# Patient Record
Sex: Female | Born: 1960 | Race: Black or African American | Hispanic: No | Marital: Married | Smoking: Former smoker
Health system: Southern US, Community
[De-identification: ages and names within clinical notes are randomized; demographics above are authoritative.]

## PROBLEM LIST (undated history)

## (undated) DIAGNOSIS — E785 Hyperlipidemia, unspecified: Secondary | ICD-10-CM

## (undated) DIAGNOSIS — I2510 Atherosclerotic heart disease of native coronary artery without angina pectoris: Secondary | ICD-10-CM

## (undated) DIAGNOSIS — I10 Essential (primary) hypertension: Secondary | ICD-10-CM

## (undated) DIAGNOSIS — I219 Acute myocardial infarction, unspecified: Secondary | ICD-10-CM

## (undated) HISTORY — PX: TUBAL LIGATION: SHX77

## (undated) HISTORY — PX: CORONARY ANGIOPLASTY: SHX604

---

## 2012-01-06 ENCOUNTER — Ambulatory Visit: Payer: Self-pay | Admitting: Gastroenterology

## 2015-01-07 ENCOUNTER — Encounter (HOSPITAL_COMMUNITY): Payer: Self-pay | Admitting: *Deleted

## 2015-01-07 ENCOUNTER — Inpatient Hospital Stay (HOSPITAL_COMMUNITY)
Admission: EM | Admit: 2015-01-07 | Discharge: 2015-01-09 | DRG: 246 | Disposition: A | Discharge status: Home / Self Care | Payer: Managed Care, Other (non HMO) | Source: Other Acute Inpatient Hospital | Attending: Cardiovascular Disease | Admitting: Cardiovascular Disease

## 2015-01-07 ENCOUNTER — Encounter (HOSPITAL_COMMUNITY)
Admission: EM | Disposition: A | Discharge status: Home / Self Care | Payer: Self-pay | Source: Other Acute Inpatient Hospital | Attending: Cardiovascular Disease

## 2015-01-07 ENCOUNTER — Other Ambulatory Visit: Payer: Self-pay

## 2015-01-07 ENCOUNTER — Ambulatory Visit (HOSPITAL_COMMUNITY): Admit: 2015-01-07 | Payer: Self-pay | Admitting: Interventional Cardiology

## 2015-01-07 DIAGNOSIS — I2111 ST elevation (STEMI) myocardial infarction involving right coronary artery: Secondary | ICD-10-CM

## 2015-01-07 DIAGNOSIS — E785 Hyperlipidemia, unspecified: Secondary | ICD-10-CM | POA: Diagnosis present

## 2015-01-07 DIAGNOSIS — Z79899 Other long term (current) drug therapy: Secondary | ICD-10-CM

## 2015-01-07 DIAGNOSIS — I2510 Atherosclerotic heart disease of native coronary artery without angina pectoris: Secondary | ICD-10-CM | POA: Diagnosis present

## 2015-01-07 DIAGNOSIS — I10 Essential (primary) hypertension: Secondary | ICD-10-CM | POA: Diagnosis present

## 2015-01-07 DIAGNOSIS — E119 Type 2 diabetes mellitus without complications: Secondary | ICD-10-CM | POA: Diagnosis present

## 2015-01-07 DIAGNOSIS — Z720 Tobacco use: Secondary | ICD-10-CM

## 2015-01-07 DIAGNOSIS — I2119 ST elevation (STEMI) myocardial infarction involving other coronary artery of inferior wall: Secondary | ICD-10-CM | POA: Diagnosis present

## 2015-01-07 DIAGNOSIS — I5033 Acute on chronic diastolic (congestive) heart failure: Secondary | ICD-10-CM | POA: Diagnosis present

## 2015-01-07 DIAGNOSIS — M549 Dorsalgia, unspecified: Secondary | ICD-10-CM | POA: Diagnosis present

## 2015-01-07 DIAGNOSIS — F17200 Nicotine dependence, unspecified, uncomplicated: Secondary | ICD-10-CM | POA: Diagnosis present

## 2015-01-07 DIAGNOSIS — I213 ST elevation (STEMI) myocardial infarction of unspecified site: Secondary | ICD-10-CM

## 2015-01-07 HISTORY — DX: Essential (primary) hypertension: I10

## 2015-01-07 HISTORY — DX: Hyperlipidemia, unspecified: E78.5

## 2015-01-07 HISTORY — DX: Acute myocardial infarction, unspecified: I21.9

## 2015-01-07 HISTORY — DX: Atherosclerotic heart disease of native coronary artery without angina pectoris: I25.10

## 2015-01-07 HISTORY — PX: LEFT HEART CATHETERIZATION WITH CORONARY ANGIOGRAM: SHX5451

## 2015-01-07 HISTORY — PX: CORONARY STENT PLACEMENT: SHX1402

## 2015-01-07 LAB — COMPREHENSIVE METABOLIC PANEL
ALK PHOS: 119 U/L — AB (ref 39–117)
ALT: 18 U/L (ref 0–35)
AST: 69 U/L — AB (ref 0–37)
Albumin: 3.4 g/dL — ABNORMAL LOW (ref 3.5–5.2)
Anion gap: 8 (ref 5–15)
BUN: 11 mg/dL (ref 6–23)
CHLORIDE: 102 mmol/L (ref 96–112)
CO2: 26 mmol/L (ref 19–32)
Calcium: 8.6 mg/dL (ref 8.4–10.5)
Creatinine, Ser: 0.87 mg/dL (ref 0.50–1.10)
GFR calc Af Amer: 87 mL/min — ABNORMAL LOW (ref >90)
GFR calc non Af Amer: 75 mL/min — ABNORMAL LOW (ref >90)
GLUCOSE: 122 mg/dL — AB (ref 70–99)
Potassium: 4 mmol/L (ref 3.5–5.1)
Sodium: 136 mmol/L (ref 135–145)
Total Bilirubin: 0.5 mg/dL (ref 0.3–1.2)
Total Protein: 7.2 g/dL (ref 6.0–8.3)

## 2015-01-07 LAB — MRSA PCR SCREENING: MRSA by PCR: NEGATIVE

## 2015-01-07 LAB — TROPONIN I
TROPONIN I: 12.76 ng/mL — AB (ref <0.031)
Troponin I: 10.31 ng/mL (ref <0.031)
Troponin I: 25.41 ng/mL (ref <0.031)
Troponin I: 21.91 ng/mL (ref <0.031)

## 2015-01-07 LAB — LIPID PANEL
CHOLESTEROL: 221 mg/dL — AB (ref 0–200)
HDL: 52 mg/dL (ref >39)
LDL CALC: 156 mg/dL — AB (ref 0–99)
Total CHOL/HDL Ratio: 4.3 RATIO
Triglycerides: 65 mg/dL (ref <150)
VLDL: 13 mg/dL (ref 0–40)

## 2015-01-07 LAB — TSH: TSH: 1.212 u[IU]/mL (ref 0.350–4.500)

## 2015-01-07 LAB — CBC
HEMATOCRIT: 41.9 % (ref 36.0–46.0)
Hemoglobin: 14.2 g/dL (ref 12.0–15.0)
MCH: 27.2 pg (ref 26.0–34.0)
MCHC: 33.9 g/dL (ref 30.0–36.0)
MCV: 80.3 fL (ref 78.0–100.0)
Platelets: 215 10*3/uL (ref 150–400)
RBC: 5.22 MIL/uL — AB (ref 3.87–5.11)
RDW: 14.5 % (ref 11.5–15.5)
WBC: 16.6 10*3/uL — ABNORMAL HIGH (ref 4.0–10.5)

## 2015-01-07 LAB — HEMOGLOBIN A1C
Hgb A1c MFr Bld: 6 % — ABNORMAL HIGH (ref <5.7)
Mean Plasma Glucose: 126 mg/dL — ABNORMAL HIGH (ref <117)

## 2015-01-07 LAB — BRAIN NATRIURETIC PEPTIDE: B NATRIURETIC PEPTIDE 5: 64.4 pg/mL (ref 0.0–100.0)

## 2015-01-07 SURGERY — LEFT HEART CATHETERIZATION WITH CORONARY ANGIOGRAM
Anesthesia: LOCAL

## 2015-01-07 MED ORDER — ACETAMINOPHEN 325 MG PO TABS: 650.0000 mg | ORAL_TABLET | ORAL | Status: DC | PRN

## 2015-01-07 MED ORDER — ASPIRIN 81 MG PO CHEW
81.0000 mg | CHEWABLE_TABLET | Freq: Every day | ORAL | Status: DC
  Filled 2015-01-07: qty 1

## 2015-01-07 MED ORDER — HYDROCHLOROTHIAZIDE 12.5 MG PO CAPS
12.5000 mg | ORAL_CAPSULE | Freq: Every day | ORAL | Status: DC
  Administered 2015-01-07 – 2015-01-09 (×3): 12.5 mg via ORAL
  Filled 2015-01-07 (×3): qty 1

## 2015-01-07 MED ORDER — VERAPAMIL HCL 2.5 MG/ML IV SOLN
INTRAVENOUS | Status: AC
  Filled 2015-01-07: qty 2

## 2015-01-07 MED ORDER — SODIUM CHLORIDE 0.9 % IV SOLN: 250.0000 mL | INTRAVENOUS | Status: DC | PRN

## 2015-01-07 MED ORDER — MIDAZOLAM HCL 2 MG/2ML IJ SOLN
INTRAMUSCULAR | Status: AC
  Filled 2015-01-07: qty 2

## 2015-01-07 MED ORDER — ALPRAZOLAM 0.5 MG PO TABS: 1.0000 mg | ORAL_TABLET | Freq: Two times a day (BID) | ORAL | Status: DC | PRN

## 2015-01-07 MED ORDER — HEPARIN (PORCINE) IN NACL 2-0.9 UNIT/ML-% IJ SOLN
INTRAMUSCULAR | Status: AC
Start: 2015-01-07 — End: 2015-01-07
  Filled 2015-01-07: qty 1000

## 2015-01-07 MED ORDER — ONDANSETRON HCL 4 MG/2ML IJ SOLN
4.0000 mg | Freq: Four times a day (QID) | INTRAMUSCULAR | Status: DC | PRN
  Administered 2015-01-07: 4 mg via INTRAVENOUS
  Filled 2015-01-07: qty 2

## 2015-01-07 MED ORDER — TICAGRELOR 90 MG PO TABS
ORAL_TABLET | ORAL | Status: AC
  Filled 2015-01-07: qty 1

## 2015-01-07 MED ORDER — NITROGLYCERIN 1 MG/10 ML FOR IR/CATH LAB
INTRA_ARTERIAL | Status: AC
  Filled 2015-01-07: qty 10

## 2015-01-07 MED ORDER — SODIUM CHLORIDE 0.9 % IV SOLN
INTRAVENOUS | Status: DC
  Administered 2015-01-07: 21:00:00 via INTRAVENOUS

## 2015-01-07 MED ORDER — OXYCODONE-ACETAMINOPHEN 5-325 MG PO TABS
1.0000 | ORAL_TABLET | ORAL | Status: DC | PRN
  Administered 2015-01-07 – 2015-01-09 (×4): 1 via ORAL
  Filled 2015-01-07 (×4): qty 1

## 2015-01-07 MED ORDER — NICOTINE 14 MG/24HR TD PT24
14.0000 mg | MEDICATED_PATCH | Freq: Every day | TRANSDERMAL | Status: DC
  Administered 2015-01-07 – 2015-01-09 (×3): 14 mg via TRANSDERMAL
  Filled 2015-01-07 (×3): qty 1

## 2015-01-07 MED ORDER — SODIUM CHLORIDE 0.9 % IV SOLN
INTRAVENOUS | Status: AC
  Administered 2015-01-07: 02:00:00 via INTRAVENOUS

## 2015-01-07 MED ORDER — SODIUM CHLORIDE 0.9 % IJ SOLN
3.0000 mL | Freq: Two times a day (BID) | INTRAMUSCULAR | Status: DC
  Administered 2015-01-07: 3 mL via INTRAVENOUS

## 2015-01-07 MED ORDER — ASPIRIN 81 MG PO CHEW
81.0000 mg | CHEWABLE_TABLET | ORAL | Status: AC
  Administered 2015-01-08: 81 mg via ORAL
  Filled 2015-01-07: qty 1

## 2015-01-07 MED ORDER — TICAGRELOR 90 MG PO TABS
90.0000 mg | ORAL_TABLET | Freq: Two times a day (BID) | ORAL | Status: DC
  Administered 2015-01-07 – 2015-01-08 (×3): 90 mg via ORAL
  Filled 2015-01-07 (×4): qty 1

## 2015-01-07 MED ORDER — MORPHINE SULFATE 2 MG/ML IJ SOLN
2.0000 mg | INTRAMUSCULAR | Status: DC | PRN
  Administered 2015-01-07 – 2015-01-08 (×5): 2 mg via INTRAVENOUS
  Filled 2015-01-07 (×5): qty 1

## 2015-01-07 MED ORDER — METOPROLOL TARTRATE 25 MG PO TABS
25.0000 mg | ORAL_TABLET | Freq: Two times a day (BID) | ORAL | Status: DC
  Administered 2015-01-07 – 2015-01-09 (×4): 25 mg via ORAL
  Filled 2015-01-07 (×6): qty 1

## 2015-01-07 MED ORDER — ATORVASTATIN CALCIUM 80 MG PO TABS
80.0000 mg | ORAL_TABLET | Freq: Every day | ORAL | Status: DC
  Administered 2015-01-07 – 2015-01-08 (×2): 80 mg via ORAL
  Filled 2015-01-07 (×3): qty 1

## 2015-01-07 MED ORDER — BIVALIRUDIN 250 MG IV SOLR
INTRAVENOUS | Status: AC
  Filled 2015-01-07: qty 250

## 2015-01-07 MED ORDER — ENOXAPARIN SODIUM 40 MG/0.4ML ~~LOC~~ SOLN
40.0000 mg | SUBCUTANEOUS | Status: DC
  Administered 2015-01-07: 40 mg via SUBCUTANEOUS
  Filled 2015-01-07 (×2): qty 0.4

## 2015-01-07 MED ORDER — AMLODIPINE BESYLATE 5 MG PO TABS
5.0000 mg | ORAL_TABLET | Freq: Every day | ORAL | Status: DC
  Administered 2015-01-07: 5 mg via ORAL
  Filled 2015-01-07 (×2): qty 1

## 2015-01-07 MED ORDER — SODIUM CHLORIDE 0.9 % IJ SOLN: 3.0000 mL | INTRAMUSCULAR | Status: DC | PRN

## 2015-01-07 MED ORDER — FENTANYL CITRATE 0.05 MG/ML IJ SOLN
INTRAMUSCULAR | Status: AC
  Filled 2015-01-07: qty 2

## 2015-01-07 MED ORDER — LIDOCAINE HCL (PF) 1 % IJ SOLN
INTRAMUSCULAR | Status: AC
  Filled 2015-01-07: qty 30

## 2015-01-07 NOTE — Progress Notes (Signed)
eLink Physician-Brief Progress Note Patient Name: Vanessa SilosGayle P Poteat Faucett DOB: 1961/09/12 MRN: [id number]   Date of Service  01/07/2015  HPI/Events of Note  Pt seen s/p cath lab for Stemi Pt stable post cath  eICU Interventions  No new orders Per cardiology     Intervention Category Evaluation Type: New Patient Evaluation  Shan Levansatrick Andron Marrazzo 01/07/2015, 2:09 AM

## 2015-01-07 NOTE — Plan of Care (Signed)
Problem: Consults Goal: Tobacco Cessation referral if indicated Outcome: Progressing Physician and RN spoke with patient regarding modifiable risk factors and the importance of smoking cessation.   Problem: Phase I Progression Outcomes Goal: Pain controlled with appropriate interventions Outcome: Progressing Patient experiencing back pain, pharmacological measures are decreasing pain but pain has not yet been relieved.

## 2015-01-07 NOTE — Progress Notes (Signed)
CRITICAL VALUE ALERT  Critical value received:  Troponin 10.3  Date of notification:  01/07/2015  Time of notification:  0340  Critical value read back:Yes.    Nurse who received alert:  Kavin LeechSondra Zhane Donlan   Expected result post STEMI and post Cardiac Cath.  Ivery QualeJackson, Mitsuo Budnick A, RN

## 2015-01-07 NOTE — CV Procedure (Addendum)
Left Heart Catheterization with Coronary Angiography and PCI Report  Vanessa Snow Faucett  54 y.o.  female 06-02-1961  Procedure Date: 01/07/2015 Referring Physician: [facility] Primary Cardiologist:: H WB Blenda Bridegroom, M.D.  INDICATIONS: Acute inferior wall ST elevation MI  PROCEDURE: 1. Left heart catheterization; 2. Coronary angiography; 3. Left ventriculography; 4. DES mid RCA  CONSENT:  The risks, benefits, and details of the procedure were explained in detail to the patient. Risks including death, stroke, heart attack, kidney injury, allergy, limb ischemia, bleeding and radiation injury were discussed.  The patient verbalized understanding and wanted to proceed.  Informed written consent was obtained.  PROCEDURE TECHNIQUE:  After Xylocaine anesthesia a 5 French Slender sheath was placed in the right radial artery with an angiocath and the modified Seldinger technique.  Coronary angiography was done using a 5 F JL 3.5 cm and 6 [place] JR4 guide catheter.  Left ventriculography was done using the JR 4 guide catheter and hand injection.   Angioplasty on the right coronary was performed prior to left coronary angiography. The initial images were obtained with a 6 [place] JR4 guide catheter. We identified a thrombus filled mid to distal 99% RCA stenosis with TIMI grade 2 flow beyond the region of stenosis. After a loading dose of oral Brilinta and iv bivalirudin followed by a continuous infusion, we performed PCI on the distal right coronary using a Pro-water guidewire and a 12 mm x 2.5 mm Emerge balloon. We positioned and deployed a 20 x 2.75 mm Synergy drug-eluting stent to 14 atm. We then post dilated with a 3.0 x 15 mm Emerge in C balloon to 14 atm. 0% stenosis and TIMI grade 3 flow was noted.  The case was terminated after left coronary angiography and left ventriculography were performed. Hemostasis was achieved with a wrist band of the right radial.   CONTRAST:  Total of  160 cc.  COMPLICATIONS:  none   HEMODYNAMICS:  Aortic pressure 126/80 mmHg; LV pressure 126/19 mmHg; LVEDP 32 mmHg  ANGIOGRAPHIC DATA:   The left main coronary artery is widely patent. It branches into a LAD and diagonal..  The left anterior descending artery is a large vessel that wraps around the left ventricular apex. It gives origin to a diagonal. The LAD at this bifurcation with the diagonal contains 40% narrowing. The proximal LAD at the left main contains 30% narrowing. The LAD is tortuous throughout its course..  The left circumflex artery is a moderate size vessel that gives origin to 2 obtuse marginal branches. After the first obtuse marginal there is an eccentric 80% stenosis before the second obtuse marginal trifurcates on the inferolateral wall. This stenosis seen only in the LAO caudal projections..  The right coronary artery is dominant and the culprit vessel with a mid to distal 99% thrombus filled stenosis.   PCI RESULTS: The 99% thrombus filled stenosis is reduced to 0% with TIMI grade 3 flow after placing a 20 x 2.75% Synergy DES postdilated to 3.0 mm in diameter at 14 atm. TIMI grade 3 flow is noted. Myocardial blush is noted.  LEFT VENTRICULOGRAM:  Left ventricular angiogram was done in the 30 RAO projection and revealed 70% without obvious regional wall motion abnormality.   IMPRESSIONS:  1. Acute inferior ST elevation myocardial infarction due to 99% thrombus filled mid to distal RCA with reduced flow. 2. Successful PTCA and stent implantation with reduction in distal RCA stenosis to 0% and TIMI grade 3 flow. 3. Residual  eccentric 80% mid circumflex stenosis. 4. Widely patent LAD 5. Overall normal LV function with markedly elevated end-diastolic pressures consistent with acute on chronic diastolic heart failure.   RECOMMENDATION:  1. Aspirin and Brilinta 2. Aggressive blood pressure control 3. Staged circumflex PCI on Monday or Tuesday or in several weeks  depending upon course and patient preference. 4. Smoking cessation 5. The patient is potentially eligible for discharge in 48-72 hours if staged procedure is going to be performed in several weeks (with Dr. Tamala Julian).

## 2015-01-07 NOTE — ED Notes (Signed)
Patient to cath lab.

## 2015-01-07 NOTE — ED Notes (Addendum)
Patient presents with c/o back pain into chest and face for 2 days but got worse in the last 1 1/2 hours ago  CBG 158  Adm Fentanyl 

## 2015-01-07 NOTE — H&P (Signed)
   PCP:   No PCP Per Patient   Chief Complaint:  Back pain, inferior STEMI  HPI: 54 year old African-American female with hypertension, tobacco abuse who presented with new onset of back pain for about 2 days prior to presentation. Her back in gut significantly worse at around 10 PM on 01/06/2015, she vomited and had shortness of breath. Ambulance was called who noted ST elevation MI. Patient was emerg[facility] where she underwent mid RCA PCI  Patient denied active symptoms of chest pain, shortness of breath, PND orthopnea, lower extremity edema, frequent or prolonged palpitations, lower extremity edema, nausea vomiting - at the time of my evaluation (after the Cath Lab)  Review of Systems:  12 systems were reviewed and were negative except mentioned in history of present illness  Past Medical History: Past Medical History  Diagnosis Date  . Hypertension    No past surgical history on file.  Medications: Amlodipine 10 mg daily HCTZ  Allergies:   Allergies  Allergen Reactions  . Ace Inhibitors   . Sulfa Antibiotics     Social History: No alcohol drug use, smokes half a pack a day for many years  Family History: No family history on file.  PHYSICAL EXAM:  Filed Vitals:   01/07/15 0016 01/07/15 0031 01/07/15 0138 01/07/15 0200  BP:   155/75 159/75  Pulse:  53 57 65  Resp:   19 18  Height: 5' 2.5" (1.588 m)     Weight: 81.647 kg (180 lb)     SpO2:   100% 98%   General:  Well appearing. No respiratory difficulty HEENT: normal Neck: supple. no JVD. Carotids 2+ bilat; no bruits. No lymphadenopathy or thryomegaly appreciated. Cor: PMI nondisplaced. Regular rate & rhythm. No rubs, gallops or murmurs. Lungs: clear Abdomen: soft, nontender, nondistended. No hepatosplenomegaly. No bruits or masses. Good bowel sounds. Extremities: no cyanosis, clubbing, rash, edema Neuro: alert & oriented x 3, cranial nerves grossly intact. moves all 4  extremities w/o difficulty. Affect pleasant.  Labs on Admission: Ordered and pending  Radiological Exams on Admission (all images were personally reviewed and interpreted by me, radiology reports were reviewed as well): No results found.  Left heart catheterization 01/07/2015 by Dr. Katrinka BlazingSmith:  1. Acute inferior ST elevation myocardial infarction due to 99% thrombus filled mid to distal RCA with reduced flow. 2. Successful PTCA and stent implantation with reduction in distal RCA stenosis to 0% and TIMI grade 3 flow. 3. Residual eccentric 80% mid circumflex stenosis. 4. Widely patent LAD 5. Overall normal LV function with markedly elevated end-diastolic pressures consistent with acute on chronic diastolic heart failure. RECOMMENDATION:  1. Aspirin and Brilinta 2. Aggressive blood pressure control 3. Staged circumflex PCI on Monday or Tuesday or in several weeks depending upon course. 4. Smoking cessation 5. The patient is potentially eligible for discharge in 48-72 hours if staged procedure is going to be performed in several weeks..  EKG personally reviewed and interpreted by me: Sinus rhythm, inferior STEMI  Assessment/Plan Present on Admission:  . ST elevation myocardial infarction (STEMI) of inferior wall - status post PCI to meet RCA with DES Tobacco abuse Hypertension  Plan: Metoprolol, amlodipine, HCTZ for blood pressure control Lipitor 80 mg daily Brilinta and aspirin Possible staged PCI this admission Nicotine patch 14 mg and smoking cessation counseling   Pardeep Pautz 01/07/2015, 2:42 AM

## 2015-01-07 NOTE — ED Provider Notes (Signed)
CSN: [id number]     Arrival date & time 01/07/15  0011 History  This chart was scribed for Tomasita Crumble, MD by Abel Presto, ED Scribe. This patient was seen in room [facility] and the patient's care was started at 12:11 AM.    No chief complaint on file.   The history is provided by the EMS personnel. No language interpreter was used.    HPI Comments: Vanessa Snow is a 53 y.o. female brought in by ambulance, with PMHx of HTN who presents to the Emergency Department complaining of waxing and waning back pain radiating to chest and face with onset 2 days ago worsening 1.5 hours PTA.  EMS states she vomited and was diaphoretic prior to EMS arrival. EMS states pain was 10/10 with a BP of 156/96. EMS states pt was given 4 ASA and 100 fentanyl.  Pt is at 100% on O2. Pt just started taking Tramadol. Pt is allergic to ace inhibitors and sufla Abx. She presented to [place], diagnosed with STEMI and transferred here to [facility] cone.  No past medical history on file. No past surgical history on file. No family history on file. History  Substance Use Topics  . Smoking status: Not on file  . Smokeless tobacco: Not on file  . Alcohol Use: Not on file   OB History    No data available     Review of Systems  Unable to perform ROS: Acuity of condition   A complete 10 system review of systems was obtained and all systems are negative except as noted in the HPI and PMH.     Allergies  Review of patient's allergies indicates not on file.  Home Medications   Prior to Admission medications   Not on File   There were no vitals taken for this visit. Physical Exam  Constitutional: She is oriented to person, place, and time. She appears well-developed and well-nourished. She appears distressed.  HENT:  Head: Normocephalic and atraumatic.  Nose: Nose normal.  Mouth/Throat: Oropharynx is clear and moist. No oropharyngeal exudate.  Eyes: Conjunctivae and EOM are normal. Pupils are equal,  round, and reactive to light. No scleral icterus.  Neck: Normal range of motion. Neck supple. No JVD present. No tracheal deviation present. No thyromegaly present.  Cardiovascular: Normal rate, regular rhythm and normal heart sounds.  Exam reveals no gallop and no friction rub.   No murmur heard. Pulmonary/Chest: Effort normal and breath sounds normal. No respiratory distress. She has no wheezes. She exhibits no tenderness.  Abdominal: Soft. Bowel sounds are normal. She exhibits no distension and no mass. There is no tenderness. There is no rebound and no guarding.  Musculoskeletal: Normal range of motion. She exhibits no edema or tenderness.  Lymphadenopathy:    She has no cervical adenopathy.  Neurological: She is alert and oriented to person, place, and time. No cranial nerve deficit. She exhibits normal muscle tone.  Skin: Skin is warm and dry. No rash noted. No erythema. No pallor.  Nursing note and vitals reviewed.   ED Course  Procedures (including critical care time) DIAGNOSTIC STUDIES: Oxygen Saturation is 100% on oxygen, normal by my interpretation.    COORDINATION OF CARE: 12:19 AM Discussed treatment plan with patient at beside, the patient agrees with the plan and has no further questions at this time.   Labs Review Labs Reviewed - No data to display  Imaging Review No results found.   EKG Interpretation None      Pt  Care started at 12:11 AM Pt transferred to Cath lab at 12:22 AM  MDM   Final diagnoses:  None   Patient presents to the emergency department as a transfer from [facility] for STEMI. Patient is hemodynamically stable in the emergency department, IV access was obtained, patient was immediately taken to the Cath Lab for treatment with defibrillation pads. EKG reveals inferior ST elevation MI with posterior extension. Cardiology was also present at the bedside for evaluation.  I personally performed the services described in this documentation, which was  scribed in my presence. The recorded information has been reviewed and is accurate.    Tomasita CrumbleAdeleke Patina Spanier, MD 01/07/15 [phone]

## 2015-01-07 NOTE — Progress Notes (Signed)
Utilization review completed.  

## 2015-01-07 NOTE — Progress Notes (Signed)
   01/07/15 0000  Clinical Encounter Type  Visited With Family;Health care provider  Visit Type Initial;Code;ED  Stress Factors  Family Stress Factors Health changes;Lack of knowledge   Chaplain was paged for a Code Stemi at 11:46PM. When chaplain arrived to the ED, chaplain was notified that the patient was being sent straight to the cath lab and that the patient's husband was in consultation B. Chaplain introduced himself to patient's husband. Chaplain escorted patient's husband to the short stay waiting room on the second floor. Patient's husband explained that he was at home with his wife this evening when she began having pain in her back. Patient's husband rubbed her back for awhile but it did not help and EMS was called. Chaplain provided patient's husband with a beverage and notified medical staff in the cath lab that the patient's husband was present and waiting in a nearby waiting room. Page Merrilyn Puman-Call chaplain if further assistance needed tonight. Cranston NeighborStrother, Sacora Hawbaker R, Chaplain  12:39 AM

## 2015-01-07 NOTE — Progress Notes (Signed)
Patient complains of upper back pain 5/10 at 0245.  Gave patient 1 tablet percocet/roxicet.   At 0430, patient stated upper back pain had worsened and felt the same as before she came to the hospital.  Rated pain 7/10.  Placed patient on 2L [place] and gave 2 mg morphine.  Paged MD to inform, orders received. Ivery QualeJackson, Chucky Homes A, RN

## 2015-01-07 NOTE — Progress Notes (Signed)
     This note represents a remote encounter  Patient Name: Vanessa Snow Date of Encounter: 01/07/2015    SUBJECTIVE: Recurrent back pain, similar to admission. Feels this is her anginal complaint.  TELEMETRY:  NSR with PVC's: Filed Vitals:   01/07/15 0530 01/07/15 0600 01/07/15 0630 01/07/15 0700  BP: 142/69 147/85 143/80 143/72  Pulse: 74 66 66 86  Temp:      TempSrc:      Resp: 11 12 15 20   Height:      Weight:      SpO2: 100% 100% 99% 100%    Intake/Output Summary (Last 24 hours) at 01/07/15 1005 Last data filed at 01/07/15 0852  Gross per 24 hour  Intake 1217.5 ml  Output    650 ml  Net  567.5 ml   LABS: Basic Metabolic Panel:  Recent Labs  16/09/9600/24/16 0226  NA 136  K 4.0  CL 102  CO2 26  GLUCOSE 122*  BUN 11  CREATININE 0.87  CALCIUM 8.6   CBC:  Recent Labs  01/07/15 0226  WBC 16.6*  HGB 14.2  HCT 41.9  MCV 80.3  PLT 215   Cardiac Enzymes:  Recent Labs  01/07/15 0226 01/07/15 0730  TROPONINI 10.31* 25.41*      Fasting Lipid Panel:  Recent Labs  01/07/15 0226  CHOL 221*  HDL 52  LDLCALC 156*  TRIG 65  CHOLHDL 4.3    Radiology/Studies:  No new data  Physical Exam: Blood pressure 143/72, pulse 86, temperature 97.9 F (36.6 C), temperature source Oral, resp. rate 20, height 5' 2.5" (1.588 m), weight 198 lb 13.7 oz (90.2 kg), SpO2 100 %. Weight change:   Wt Readings from Last 3 Encounters:  01/07/15 198 lb 13.7 oz (90.2 kg)    Not performed.  ASSESSMENT:  1. Inferior STEMI treated with DES with nice angiographic result. 2. Recurrent back pain (ischemic complaint) without ECG changes. Uncertain significance but doubt related to infarct vessel. ? Circumflex.  Plan:  Proceed with PCI of Cfx prior to discharge. I will write orders. Name needs to be placed on cath schedule in AM.  Signed, Veatrice KellsSMITH III,Lynnett Langlinais W 01/07/2015, 10:05 AM

## 2015-01-08 ENCOUNTER — Encounter (HOSPITAL_COMMUNITY): Payer: Self-pay | Admitting: General Practice

## 2015-01-08 ENCOUNTER — Encounter (HOSPITAL_COMMUNITY)
Admission: EM | Disposition: A | Discharge status: Home / Self Care | Payer: Managed Care, Other (non HMO) | Source: Other Acute Inpatient Hospital | Attending: Cardiovascular Disease

## 2015-01-08 ENCOUNTER — Other Ambulatory Visit: Payer: Self-pay

## 2015-01-08 DIAGNOSIS — I10 Essential (primary) hypertension: Secondary | ICD-10-CM

## 2015-01-08 DIAGNOSIS — I2119 ST elevation (STEMI) myocardial infarction involving other coronary artery of inferior wall: Principal | ICD-10-CM

## 2015-01-08 HISTORY — PX: PERCUTANEOUS CORONARY STENT INTERVENTION (PCI-S): SHX5485

## 2015-01-08 HISTORY — PX: CARDIAC CATHETERIZATION: SHX172

## 2015-01-08 LAB — BASIC METABOLIC PANEL
ANION GAP: 10 (ref 5–15)
BUN: 7 mg/dL (ref 6–23)
CO2: 26 mmol/L (ref 19–32)
CREATININE: 0.85 mg/dL (ref 0.50–1.10)
Calcium: 8.8 mg/dL (ref 8.4–10.5)
Chloride: 103 mmol/L (ref 96–112)
GFR calc Af Amer: 89 mL/min — ABNORMAL LOW (ref >90)
GFR calc non Af Amer: 77 mL/min — ABNORMAL LOW (ref >90)
Glucose, Bld: 89 mg/dL (ref 70–99)
Potassium: 3.9 mmol/L (ref 3.5–5.1)
Sodium: 139 mmol/L (ref 135–145)

## 2015-01-08 LAB — TROPONIN I
Troponin I: 11.95 ng/mL (ref <0.031)
Troponin I: 9.2 ng/mL (ref <0.031)
Troponin I: 8.22 ng/mL (ref <0.031)
Troponin I: 6.19 ng/mL (ref <0.031)

## 2015-01-08 LAB — PROTIME-INR
INR: 1.29 (ref 0.00–1.49)
Prothrombin Time: 16.2 seconds — ABNORMAL HIGH (ref 11.6–15.2)

## 2015-01-08 LAB — CBC
HEMATOCRIT: 41.9 % (ref 36.0–46.0)
Hemoglobin: 14 g/dL (ref 12.0–15.0)
MCH: 27.1 pg (ref 26.0–34.0)
MCHC: 33.4 g/dL (ref 30.0–36.0)
MCV: 81.2 fL (ref 78.0–100.0)
PLATELETS: 205 10*3/uL (ref 150–400)
RBC: 5.16 MIL/uL — AB (ref 3.87–5.11)
RDW: 14.5 % (ref 11.5–15.5)
WBC: 10 10*3/uL (ref 4.0–10.5)

## 2015-01-08 LAB — POCT ACTIVATED CLOTTING TIME: Activated Clotting Time: 632 seconds

## 2015-01-08 SURGERY — PERCUTANEOUS CORONARY STENT INTERVENTION (PCI-S)
Anesthesia: LOCAL

## 2015-01-08 MED ORDER — BIVALIRUDIN 250 MG IV SOLR
INTRAVENOUS | Status: AC
  Filled 2015-01-08: qty 250

## 2015-01-08 MED ORDER — ASPIRIN 81 MG PO CHEW
81.0000 mg | CHEWABLE_TABLET | Freq: Every day | ORAL | Status: DC
  Administered 2015-01-09: 81 mg via ORAL
  Filled 2015-01-08: qty 1

## 2015-01-08 MED ORDER — HEPARIN (PORCINE) IN NACL 2-0.9 UNIT/ML-% IJ SOLN
INTRAMUSCULAR | Status: AC
  Filled 2015-01-08: qty 1000

## 2015-01-08 MED ORDER — LIDOCAINE HCL (PF) 1 % IJ SOLN
INTRAMUSCULAR | Status: AC
  Filled 2015-01-08: qty 30

## 2015-01-08 MED ORDER — TICAGRELOR 90 MG PO TABS
90.0000 mg | ORAL_TABLET | Freq: Two times a day (BID) | ORAL | Status: DC
  Administered 2015-01-08 – 2015-01-09 (×2): 90 mg via ORAL
  Filled 2015-01-08 (×3): qty 1

## 2015-01-08 MED ORDER — LOSARTAN POTASSIUM 50 MG PO TABS
50.0000 mg | ORAL_TABLET | Freq: Every day | ORAL | Status: DC
  Administered 2015-01-08 – 2015-01-09 (×2): 50 mg via ORAL
  Filled 2015-01-08 (×2): qty 1

## 2015-01-08 MED ORDER — VERAPAMIL HCL 2.5 MG/ML IV SOLN
INTRAVENOUS | Status: AC
  Filled 2015-01-08: qty 2

## 2015-01-08 MED ORDER — HEPARIN SODIUM (PORCINE) 1000 UNIT/ML IJ SOLN
INTRAMUSCULAR | Status: AC
  Filled 2015-01-08: qty 1

## 2015-01-08 MED ORDER — FENTANYL CITRATE 0.05 MG/ML IJ SOLN
INTRAMUSCULAR | Status: AC
  Filled 2015-01-08: qty 2

## 2015-01-08 MED ORDER — TICAGRELOR 90 MG PO TABS
ORAL_TABLET | ORAL | Status: AC
  Filled 2015-01-08: qty 1

## 2015-01-08 MED ORDER — SODIUM CHLORIDE 0.9 % IV SOLN
1.0000 mL/kg/h | INTRAVENOUS | Status: AC
  Administered 2015-01-08: 12:00:00 1 mL/kg/h via INTRAVENOUS

## 2015-01-08 MED ORDER — HEART ATTACK BOUNCING BOOK
Freq: Once | Status: AC
  Administered 2015-01-08: 21:00:00
  Filled 2015-01-08: qty 1

## 2015-01-08 MED ORDER — PNEUMOCOCCAL VAC POLYVALENT 25 MCG/0.5ML IJ INJ
0.5000 mL | INJECTION | INTRAMUSCULAR | Status: AC
  Administered 2015-01-09: 0.5 mL via INTRAMUSCULAR
  Filled 2015-01-08: qty 0.5

## 2015-01-08 MED ORDER — MIDAZOLAM HCL 2 MG/2ML IJ SOLN
INTRAMUSCULAR | Status: AC
  Filled 2015-01-08: qty 2

## 2015-01-08 MED ORDER — NITROGLYCERIN 1 MG/10 ML FOR IR/CATH LAB
INTRA_ARTERIAL | Status: AC
  Filled 2015-01-08: qty 10

## 2015-01-08 MED FILL — Sodium Chloride IV Soln 0.9%: INTRAVENOUS | Qty: 50 | Status: AC

## 2015-01-08 NOTE — CV Procedure (Signed)
    CARDIAC CATH NOTE  Name: Vanessa BerlinGayle P Poteat Vanessa Snow MRN: [id number] DOB: 1961-03-04  Procedure: PTCA and stenting of the LCx  Indication: 54 yo BF with STEMI involving the RCA s/p DES over the weekend. Cardiac cath demonstrated a 80-90% proximal LCx lesion and she returns for a staged procedure.   Procedural Details: The right wrist was prepped, draped, and anesthetized with 1% lidocaine. Using the modified Seldinger technique, a 6 Fr slender sheath was introduced into the radial artery. 3 mg verapamil was administered through the radial sheath. Weight-based bivalirudin was given for anticoagulation. Once a therapeutic ACT was achieved, a 6 [place] CLS 3.5 guide catheter was inserted.  A prowater coronary guidewire was used to cross the lesion.  The lesion was predilated with a 2.5 mm balloon.  The lesion was then stented with a 3.0 x 16 mm Synergy stent.  The stent was postdilated with a 3.0 mm noncompliant balloon.  Following PCI, there was 0% residual stenosis and TIMI-3 flow. Final angiography confirmed an excellent result. The patient tolerated the procedure well. There were no immediate procedural complications. A TR band was used for radial hemostasis. The patient was transferred to the post catheterization recovery area for further monitoring.  Lesion Data: Vessel: LCx Percent stenosis (pre): 80-90% TIMI-flow (pre):  3 Stent:  3.0 x 16 mm Synergy Percent stenosis (post): 0% TIMI-flow (post): 3  Conclusions:  1. Successful stenting of the proximal LCx with a DES  Recommendations: DAPT for one year. Anticipate DC tomorrow am.  Rosellen Lichtenberger [place], MDFACC 01/08/2015, 11:12 AM

## 2015-01-08 NOTE — Progress Notes (Signed)
PROGRESS NOTE  Subjective:   Vanessa Snow is a 54 yo with a history of cigarette smoking who presented on January 24 with 2 days of back pain. She was found have an inferior wall myocardial infarction.  She had urgent cardiac catheterization  and had a 20 x 2.75 mm Synergy drug-eluting stent positioned. The stent was postdilated using a 3.0 mm balloon.  She was found have minor luminal irregularities of the LAD. The first acute marginal had an eccentric 80% stenosis.  The plan was to perform a staged PCI on the OM 1 stenosis today.    Objective:    Vital Signs:   Temp:  [98 F (36.7 C)-98.7 F (37.1 C)] 98 F (36.7 C) (01/25 0848) Pulse Rate:  [57-86] 64 (01/25 0848) Resp:  [9-35] 16 (01/25 0848) BP: (100-152)/(40-97) 125/77 mmHg (01/25 0848) SpO2:  [96 %-100 %] 98 % (01/25 0848) Weight:  [196 lb 10.4 oz (89.2 kg)] 196 lb 10.4 oz (89.2 kg) (01/25 0400)  Last BM Date: 01/06/15   24-hour weight change: Weight change: 16 lb 10.4 oz (7.553 kg)  Weight trends: Filed Weights   01/07/15 0016 01/07/15 0138 01/08/15 0400  Weight: 180 lb (81.647 kg) 198 lb 13.7 oz (90.2 kg) 196 lb 10.4 oz (89.2 kg)    Intake/Output:  01/24 0701 - 01/25 0700 In: 1725 [P.O.:1000; I.V.:725] Out: -      Physical Exam: BP 125/77 mmHg  Pulse 64  Temp(Src) 98 F (36.7 C) (Oral)  Resp 16  Ht 5' 2.5" (1.588 m)  Wt 196 lb 10.4 oz (89.2 kg)  BMI 35.37 kg/m2  SpO2 98%  Wt Readings from Last 3 Encounters:  01/08/15 196 lb 10.4 oz (89.2 kg)    General: Vital signs reviewed and noted.  somulent   Head: Normocephalic, atraumatic.  Eyes: conjunctivae/corneas clear.  EOM's intact.   Throat: normal  Neck:  normal   Lungs:    clear   Heart:   RR   Abdomen:  Soft, non-tender, non-distended    Extremities: No edema    Neurologic: A&O X3, CN II - XII are grossly intact.   Psych: Normal     Labs: BMET:  Recent Labs  01/07/15 0226 01/08/15 0755  NA 136 139  K 4.0 3.9  CL 102 103    CO2 26 26  GLUCOSE 122* 89  BUN 11 7  CREATININE 0.87 0.85  CALCIUM 8.6 8.8    Liver function tests:  Recent Labs  01/07/15 0226  AST 69*  ALT 18  ALKPHOS 119*  BILITOT 0.5  PROT 7.2  ALBUMIN 3.4*   No results for input(s): LIPASE, AMYLASE in the last 72 hours.  CBC:  Recent Labs  01/07/15 0226 01/08/15 0755  WBC 16.6* 10.0  HGB 14.2 14.0  HCT 41.9 41.9  MCV 80.3 81.2  PLT 215 205    Cardiac Enzymes:  Recent Labs  01/07/15 1413 01/07/15 1930 01/08/15 0015 01/08/15 0755  TROPONINI 21.91* 12.76* 11.95* 9.20*    Coagulation Studies:  Recent Labs  01/08/15 0755  LABPROT 16.2*  INR 1.29    Other: Invalid input(s): POCBNP No results for input(s): DDIMER in the last 72 hours.  Recent Labs  01/07/15 0226  HGBA1C 6.0*    Recent Labs  01/07/15 0226  CHOL 221*  HDL 52  LDLCALC 156*  TRIG 65  CHOLHDL 4.3    Recent Labs  01/07/15 0730  TSH 1.212   No results for input(s): VITAMINB12,  FOLATE, FERRITIN, TIBC, IRON, RETICCTPCT in the last 72 hours.   Other results:  EKG :  NSR inf. TWI.    Medications:    Infusions: . sodium chloride 75 mL/hr at 01/07/15 2120    Scheduled Medications: . amLODipine  5 mg Oral Daily  . atorvastatin  80 mg Oral q1800  . enoxaparin (LOVENOX) injection  40 mg Subcutaneous Q24H  . hydrochlorothiazide  12.5 mg Oral Daily  . metoprolol tartrate  25 mg Oral BID  . nicotine  14 mg Transdermal Daily  . sodium chloride  3 mL Intravenous Q12H  . ticagrelor  90 mg Oral BID    Assessment/ Plan:      1.  CAD :    ST-segment elevation myocardial infarction (STEMI) of inferior wall  treated with PCI on the day of admission. She was also found to have an obtuse marginal stenosis. The plan is for PCI of the OM today.    2.  Essential hypertension:  She was on diltiazem and losartan HCTZ at home. During this hospitalization she's been started on metoprolol. The diltiazem was changed to amlodipine.  I think  she would actually be better on losartan and metoprolol. We can add amlodipine if she needs additional blood pressure control.  3.   Tobacco abuse:  She needs to quit smoking.    Disposition: PCI of OM today.  Length of Stay: 1  Vesta Mixer, Montez Hageman., MD, [facility] 01/08/2015, 9:49 AM Office [phone] Pager [phone]

## 2015-01-08 NOTE — H&P (View-Only) (Signed)
    PROGRESS NOTE  Subjective:   Vanessa Snow is a 53 yo with a history of cigarette smoking who presented on January 24 with 2 days of back pain. She was found have an inferior wall myocardial infarction.  She had urgent cardiac catheterization  and had a 20 x 2.75 mm Synergy drug-eluting stent positioned. The stent was postdilated using a 3.0 mm balloon.  She was found have minor luminal irregularities of the LAD. The first acute marginal had an eccentric 80% stenosis.  The plan was to perform a staged PCI on the OM 1 stenosis today.    Objective:    Vital Signs:   Temp:  [98 F (36.7 C)-98.7 F (37.1 C)] 98 F (36.7 C) (01/25 0848) Pulse Rate:  [57-86] 64 (01/25 0848) Resp:  [9-35] 16 (01/25 0848) BP: (100-152)/(40-97) 125/77 mmHg (01/25 0848) SpO2:  [96 %-100 %] 98 % (01/25 0848) Weight:  [196 lb 10.4 oz (89.2 kg)] 196 lb 10.4 oz (89.2 kg) (01/25 0400)  Last BM Date: 01/06/15   24-hour weight change: Weight change: 16 lb 10.4 oz (7.553 kg)  Weight trends: Filed Weights   01/07/15 0016 01/07/15 0138 01/08/15 0400  Weight: 180 lb (81.647 kg) 198 lb 13.7 oz (90.2 kg) 196 lb 10.4 oz (89.2 kg)    Intake/Output:  01/24 0701 - 01/25 0700 In: 1725 [P.O.:1000; I.V.:725] Out: -      Physical Exam: BP 125/77 mmHg  Pulse 64  Temp(Src) 98 F (36.7 C) (Oral)  Resp 16  Ht 5' 2.5" (1.588 m)  Wt 196 lb 10.4 oz (89.2 kg)  BMI 35.37 kg/m2  SpO2 98%  Wt Readings from Last 3 Encounters:  01/08/15 196 lb 10.4 oz (89.2 kg)    General: Vital signs reviewed and noted.  somulent   Head: Normocephalic, atraumatic.  Eyes: conjunctivae/corneas clear.  EOM's intact.   Throat: normal  Neck:  normal   Lungs:    clear   Heart:   RR   Abdomen:  Soft, non-tender, non-distended    Extremities: No edema    Neurologic: A&O X3, CN II - XII are grossly intact.   Psych: Normal     Labs: BMET:  Recent Labs  01/07/15 0226 01/08/15 0755  NA 136 139  K 4.0 3.9  CL 102 103    CO2 26 26  GLUCOSE 122* 89  BUN 11 7  CREATININE 0.87 0.85  CALCIUM 8.6 8.8    Liver function tests:  Recent Labs  01/07/15 0226  AST 69*  ALT 18  ALKPHOS 119*  BILITOT 0.5  PROT 7.2  ALBUMIN 3.4*   No results for input(s): LIPASE, AMYLASE in the last 72 hours.  CBC:  Recent Labs  01/07/15 0226 01/08/15 0755  WBC 16.6* 10.0  HGB 14.2 14.0  HCT 41.9 41.9  MCV 80.3 81.2  PLT 215 205    Cardiac Enzymes:  Recent Labs  01/07/15 1413 01/07/15 1930 01/08/15 0015 01/08/15 0755  TROPONINI 21.91* 12.76* 11.95* 9.20*    Coagulation Studies:  Recent Labs  01/08/15 0755  LABPROT 16.2*  INR 1.29    Other: Invalid input(s): POCBNP No results for input(s): DDIMER in the last 72 hours.  Recent Labs  01/07/15 0226  HGBA1C 6.0*    Recent Labs  01/07/15 0226  CHOL 221*  HDL 52  LDLCALC 156*  TRIG 65  CHOLHDL 4.3    Recent Labs  01/07/15 0730  TSH 1.212   No results for input(s): VITAMINB12,   FOLATE, FERRITIN, TIBC, IRON, RETICCTPCT in the last 72 hours.   Other results:  EKG :  NSR inf. TWI.    Medications:    Infusions: . sodium chloride 75 mL/hr at 01/07/15 2120    Scheduled Medications: . amLODipine  5 mg Oral Daily  . atorvastatin  80 mg Oral q1800  . enoxaparin (LOVENOX) injection  40 mg Subcutaneous Q24H  . hydrochlorothiazide  12.5 mg Oral Daily  . metoprolol tartrate  25 mg Oral BID  . nicotine  14 mg Transdermal Daily  . sodium chloride  3 mL Intravenous Q12H  . ticagrelor  90 mg Oral BID    Assessment/ Plan:      1.  CAD :    ST-segment elevation myocardial infarction (STEMI) of inferior wall  treated with PCI on the day of admission. She was also found to have an obtuse marginal stenosis. The plan is for PCI of the OM today.    2.  Essential hypertension:  She was on diltiazem and losartan HCTZ at home. During this hospitalization she's been started on metoprolol. The diltiazem was changed to amlodipine.  I think  she would actually be better on losartan and metoprolol. We can add amlodipine if she needs additional blood pressure control.  3.   Tobacco abuse:  She needs to quit smoking.    Disposition: PCI of OM today.  Length of Stay: 1  Jacarie Pate J. Zarayah Lanting, Jr., MD, FACC 01/08/2015, 9:49 AM Office [phone] Pager [phone]    

## 2015-01-08 NOTE — Care Management Note (Unsigned)
    Page 1 of 1   01/08/2015     5:29:16 PM CARE MANAGEMENT NOTE 01/08/2015  Patient:  Vanessa Snow, Vanessa Snow   Account Number:  [id number]  Date Initiated:  01/08/2015  Documentation initiated by:  Jamion Carter  Subjective/Objective Assessment:   Pt s/p PCI today     Action/Plan:   Pt to dc on Brilinta therapy   Anticipated DC Date:  01/09/2015   Anticipated DC Plan:  [place]  CM consult  Medication Assistance      Choice offered to / List presented to:             Status of service:  In process, will continue to follow Medicare Important Message given?   (If response is "NO", the following Medicare IM given date fields will be blank) Date Medicare IM given:   Medicare IM given by:   Date Additional Medicare IM given:   Additional Medicare IM given by:    Discharge Disposition:    Per UR Regulation:  Reviewed for med. necessity/level of care/duration of stay  If discussed at [place] of Stay Meetings, dates discussed:    Comments:  01/08/15 Ellan Lambert, RN, BSN [phone]  [place] [facility] at [phone]. Talked to [place]. BRILINTA is covered. No prior authorization required. Co-Payment is $304.47 for 30 day supply (60 Quantity). Preferred pharmacy for [place] is [facility]. No genric form of this medication is available per CSR Selena. [facility]. Pt has $6000 deductible that has to be met.  Spoke with pt; she states she cannot afford this copay. Copay card will reduce by $100, but this is still too much for her. Called Dr. Arcola Jansky states to try Effient.  With Effient copay card, pt can receive med free for one year.  Will leave Effient booklet and savings cards with pt.

## 2015-01-08 NOTE — Interval H&P Note (Signed)
History and Physical Interval Note:  01/08/2015 10:29 AM  Vanessa Snow  has presented today for surgery, with the diagnosis of CAD  The various methods of treatment have been discussed with the patient and family. After consideration of risks, benefits and other options for treatment, the patient has consented to  Procedure(s): PERCUTANEOUS CORONARY STENT INTERVENTION (PCI-S) (N/A) as a surgical intervention .  The patient's history has been reviewed, patient examined, no change in status, stable for surgery.  I have reviewed the patient's chart and labs.  Questions were answered to the patient's satisfaction.   Cath Lab Visit (complete for each Cath Lab visit)  Clinical Evaluation Leading to the Procedure:   ACS: No.  Non-ACS:    Anginal Classification: CCS II  Anti-ischemic medical therapy: Maximal Therapy (2 or more classes of medications)  Non-Invasive Test Results: No non-invasive testing performed  Prior CABG: No previous CABG        Theron Aristaeter [facility] For Human Services IncJordanMD,FACC 01/08/2015 10:29 AM

## 2015-01-08 NOTE — Progress Notes (Signed)
TR BAND REMOVAL  LOCATION:  right radial  DEFLATED PER PROTOCOL:  Yes.    TIME BAND OFF / DRESSING APPLIED:   1630   SITE UPON ARRIVAL:   Level 1  SITE AFTER BAND REMOVAL:  Level 1  REVERSE ALLEN'S TEST:    positive  CIRCULATION SENSATION AND MOVEMENT:  Within Normal Limits  Yes.    COMMENTS:

## 2015-01-09 ENCOUNTER — Encounter (HOSPITAL_COMMUNITY): Payer: Self-pay | Admitting: Cardiology

## 2015-01-09 ENCOUNTER — Inpatient Hospital Stay (HOSPITAL_COMMUNITY): Payer: Managed Care, Other (non HMO)

## 2015-01-09 DIAGNOSIS — I2510 Atherosclerotic heart disease of native coronary artery without angina pectoris: Secondary | ICD-10-CM

## 2015-01-09 DIAGNOSIS — E119 Type 2 diabetes mellitus without complications: Secondary | ICD-10-CM

## 2015-01-09 DIAGNOSIS — E1159 Type 2 diabetes mellitus with other circulatory complications: Secondary | ICD-10-CM

## 2015-01-09 DIAGNOSIS — E785 Hyperlipidemia, unspecified: Secondary | ICD-10-CM

## 2015-01-09 HISTORY — DX: Hyperlipidemia, unspecified: E78.5

## 2015-01-09 HISTORY — DX: Atherosclerotic heart disease of native coronary artery without angina pectoris: I25.10

## 2015-01-09 LAB — BASIC METABOLIC PANEL
ANION GAP: 7 (ref 5–15)
BUN: 10 mg/dL (ref 6–23)
CHLORIDE: 105 mmol/L (ref 96–112)
CO2: 27 mmol/L (ref 19–32)
Calcium: 8.8 mg/dL (ref 8.4–10.5)
Creatinine, Ser: 0.78 mg/dL (ref 0.50–1.10)
GFR calc Af Amer: >90 mL/min (ref >90)
GFR calc non Af Amer: >90 mL/min (ref >90)
Glucose, Bld: 99 mg/dL (ref 70–99)
POTASSIUM: 3.9 mmol/L (ref 3.5–5.1)
SODIUM: 139 mmol/L (ref 135–145)

## 2015-01-09 LAB — CBC
HEMATOCRIT: 41.6 % (ref 36.0–46.0)
Hemoglobin: 14 g/dL (ref 12.0–15.0)
MCH: 27.3 pg (ref 26.0–34.0)
MCHC: 33.7 g/dL (ref 30.0–36.0)
MCV: 81.3 fL (ref 78.0–100.0)
PLATELETS: 205 10*3/uL (ref 150–400)
RBC: 5.12 MIL/uL — ABNORMAL HIGH (ref 3.87–5.11)
RDW: 14.3 % (ref 11.5–15.5)
WBC: 10.3 10*3/uL (ref 4.0–10.5)

## 2015-01-09 LAB — POCT ACTIVATED CLOTTING TIME
Activated Clotting Time: 356 seconds
Activated Clotting Time: 638 seconds

## 2015-01-09 LAB — TROPONIN I
TROPONIN I: 6.02 ng/mL — AB (ref <0.031)
TROPONIN I: 4.85 ng/mL — AB (ref <0.031)
Troponin I: 6.44 ng/mL (ref <0.031)

## 2015-01-09 MED ORDER — ASPIRIN 81 MG PO CHEW: 81.0000 mg | CHEWABLE_TABLET | Freq: Every day | ORAL | Status: AC

## 2015-01-09 MED ORDER — ATORVASTATIN CALCIUM 80 MG PO TABS: 80.0000 mg | ORAL_TABLET | Freq: Every day | ORAL | Status: DC

## 2015-01-09 MED ORDER — NICOTINE 14 MG/24HR TD PT24: 14.0000 mg | MEDICATED_PATCH | Freq: Every day | TRANSDERMAL | Status: DC

## 2015-01-09 MED ORDER — METOPROLOL TARTRATE 25 MG PO TABS: 25.0000 mg | ORAL_TABLET | Freq: Two times a day (BID) | ORAL | Status: DC

## 2015-01-09 MED ORDER — TICAGRELOR 90 MG PO TABS: 90.0000 mg | ORAL_TABLET | Freq: Two times a day (BID) | ORAL | Status: DC

## 2015-01-09 MED ORDER — ACETAMINOPHEN 325 MG PO TABS: 650.0000 mg | ORAL_TABLET | ORAL | Status: AC | PRN

## 2015-01-09 MED ORDER — NITROGLYCERIN 0.4 MG SL SUBL: 0.4000 mg | SUBLINGUAL_TABLET | SUBLINGUAL | Status: DC | PRN

## 2015-01-09 MED FILL — Sodium Chloride IV Soln 0.9%: INTRAVENOUS | Qty: 50 | Status: AC

## 2015-01-09 NOTE — Progress Notes (Signed)
CARDIAC REHAB PHASE I   PRE:  Rate/Rhythm: 66 SR    BP: sitting 139/73    SaO2:   MODE:  Ambulation: 440 ft   POST:  Rate/Rhythm: 82 SR    BP: sitting 153/79     SaO2:   Tolerated well, no c/o. Pt fairly quiet but answers questions when asked. Ed completed with pt and husband. Plans to quit smoking, sts she has quit for 4 yrs before. Interested in [facility] and will send referral to [facility]. [phone]   Harriet Massoneeve, Revin Corker Kristan CES, ACSM 01/09/2015 9:29 AM

## 2015-01-09 NOTE — Progress Notes (Addendum)
Subjective: Some back pain, but not bad  Objective: Vital signs in last 24 hours: Temp:  [98 F (36.7 C)-98.6 F (37 C)] 98.6 F (37 C) (01/26 16100608) Pulse Rate:  [56-73] 72 (01/26 0608) Resp:  [16-20] 20 (01/26 96040608) BP: (92-168)/(39-89) 115/61 mmHg (01/26 0608) SpO2:  [96 %-100 %] 96 % (01/26 0608) Weight:  [197 lb 15.6 oz (89.8 kg)] 197 lb 15.6 oz (89.8 kg) (01/26 0018) Weight change: 1 lb 5.2 oz (0.6 kg) Last BM Date: 01/04/15 Intake/Output from previous day: +1271 01/25 0701 - 01/26 0700 In: 1471.8 [P.O.:840; I.V.:631.8] Out: 200 [Urine:200] Intake/Output this shift: Total I/O In: 120 [P.O.:120] Out: -   PE: General:Pleasant affect somewhat flat, NAD Skin:Warm and dry, brisk capillary refill HEENT:normocephalic, sclera clear, mucus membranes moist Heart:S1S2 RRR without murmur, gallup, rub or click Lungs:without rales, rhonchi, occ wheeze VWU:JWJXAbd:soft, non tender, + BS, do not palpate liver spleen or masses Ext:no lower ext edema, 2+ pedal pulses, 2+ radial pulses, rt wrist cath site without hematoma  Neuro:alert and oriented X 3, MAE, follows commands, + facial symmetry TELE: SB at 55 on occ.    EKG- T wave inversion in inf. Leads improved.    Lab Results:  Recent Labs  01/08/15 0755 01/09/15 0126  WBC 10.0 10.3  HGB 14.0 14.0  HCT 41.9 41.6  PLT 205 205   BMET  Recent Labs  01/08/15 0755 01/09/15 0126  NA 139 139  K 3.9 3.9  CL 103 105  CO2 26 27  GLUCOSE 89 99  BUN 7 10  CREATININE 0.85 0.78  CALCIUM 8.8 8.8    Recent Labs  01/08/15 1925 01/09/15 0126  TROPONINI 6.19* 6.44*    Lab Results  Component Value Date   CHOL 221* 01/07/2015   HDL 52 01/07/2015   LDLCALC 156* 01/07/2015   TRIG 65 01/07/2015   CHOLHDL 4.3 01/07/2015   Lab Results  Component Value Date   HGBA1C 6.0* 01/07/2015     Lab Results  Component Value Date   TSH 1.212 01/07/2015    Hepatic Function Panel  Recent Labs  01/07/15 0226  PROT 7.2    ALBUMIN 3.4*  AST 69*  ALT 18  ALKPHOS 119*  BILITOT 0.5    Recent Labs  01/07/15 0226  CHOL 221*   No results for input(s): PROTIME in the last 72 hours.     Studies/Results: No results found.  Medications: I have reviewed the patient's current medications. Scheduled Meds: . aspirin  81 mg Oral Daily  . atorvastatin  80 mg Oral q1800  . hydrochlorothiazide  12.5 mg Oral Daily  . losartan  50 mg Oral Daily  . metoprolol tartrate  25 mg Oral BID  . nicotine  14 mg Transdermal Daily  . pneumococcal 23 valent vaccine  0.5 mL Intramuscular Tomorrow-1000  . ticagrelor  90 mg Oral BID   Continuous Infusions:  PRN Meds:.acetaminophen, ALPRAZolam, morphine injection, ondansetron (ZOFRAN) IV, oxyCODONE-acetaminophen  Assessment/Plan: 54 yo BF with STEMI involving the RCA s/p DES 01/07/15. Cardiac cath also demonstrated a 80-90% proximal LCx lesion and she returned to cath lab 01/08/15 for a staged procedure.  Successful stenting of the proximal LCx with a DES- Synergy. Pk troponin 25.4- now 6.44 up slightly from last pm.   LDL 156. HgBA1C 6.0.   Has not had CXR- will check this AM  1. CAD : ST-segment elevation myocardial infarction (STEMI) of inferior wall treated with PCI on the  day of admission. She was also found to have an obtuse marginal stenosis. Also successful PCI of the LCX/OM 01/08/15.   2. Essential hypertension: She was on diltiazem and losartan HCTZ at home. During this hospitalization she's been started on metoprolol. The diltiazem was changed to amlodipine but stopped.  BP stable this am on losartan and metoprolol.   We can add amlodipine if she needs additional blood pressure control.  3. Tobacco abuse: She needs to quit smoking. Check CXR  4.  Hyperlipidemia- on lipitor 80 mg daily.   Continue.  5. Borderline diabetes- will need diet instruction.  Follow up with PCP.   If ambulates without problems, possible discharge after cardiac rehab eval.   Follow up in 5-7 days.   Pt works in Set designer, some lifting and pushing and pulling.  No work for 3-4 weeks ?  MD to decide.        LOS: 2 days   Time spent with pt. :15 minutes. [facility] R  Nurse Practitioner Certified Pager [phone] or after 5pm and on weekends call [phone] 01/09/2015, 6:58 AM   I have examined the patient and reviewed assessment and plan and discussed with patient.  Agree with above as stated.  No bleeding at the radial site.  Out of work for 2 weeks.   Continue DAPT.  OK to d/c if she does well walking with cardiac rehab.  High dose lipitor.  Donnamaria Shands S.

## 2015-01-09 NOTE — Progress Notes (Signed)
  RD consulted for nutrition education  Dietetic Intern provided "Type 2 Diabetes Nutrition Therapy" handout from the Academy of Nutrition and Dietetics and the "Plate Method" handout.  Discussed carbohydrate foods and different food groups with patient and emphasized the importance of eating 3 meals a day or every 3-4 hours.  Discussed importance of consuming fiber and making half of your plate vegetables and saving the carbohydrate foods for last during a meal.    Reviewed servings of carbohydrates with patient and provided examples of what a serving of carbohydrate is.  Patient expressed understanding.  Teach back method used.  Pt expressed she is a picky eater and eats one meal a day and maybe snacks.  She likes fried foods but reports she will cut back and start to eat at regular intervals.  Expect good compliance.    Body Mass Index of 35.5  Pt meets criteria for Obesity.   Pt currently on Heart Healthy diet.   50-80% PO intake  Magdalen SpatzLauren Asianna Brundage MS Dietetic Intern Pager Number [phone]

## 2015-01-09 NOTE — Discharge Instructions (Signed)
[facility] at [phone] if any bleeding, swelling or drainage at cath site.  May shower, no tub baths for 48 hours for groin sticks. No lifting over 5 pounds for 5 days.  No Driving for 5 days.  No work for 2 weeks.  Exercise as instructed by cardiac rehab.  Take 1 NTG, under your tongue, while sitting.  If no relief of pain may repeat NTG, one tab every 5 minutes up to 3 tablets total over 15 minutes.  If no relief CALL 911.  If you have dizziness/lightheadness  while taking NTG, stop taking and call 911.        You are several new medications.  Do not stop Brilinta or asprin, these keep your stents open.  You are borderline diabetic, stop sweets when possible.  Use whole wheat bread instead of white bread, decrease breads overall.  Follow up with your primary MD concerning your borderline diabetes.  Stop smoking, we have ordered nicoderm patches.   Call if you have questions.

## 2015-01-09 NOTE — Discharge Summary (Addendum)
Physician Discharge Summary       Patient ID: Vanessa Snow MRN: [id number] DOB/AGE: 02-06-1961 54 y.o.  Admit date: 01/07/2015 Discharge date: 01/09/2015 Primary Cardiologist: Dr.    Discharge Diagnoses:  Principal Problem:   ST-segment elevation myocardial infarction (STEMI) of inferior wall Active Problems:   CAD, multiple vessel, STEMI-DES-RCA 01/07/15, pLCX DES-01/08/15   Essential hypertension   Tobacco abuse   Hyperlipidemia LDL goal <70   DM2 (diabetes mellitus, type 2)   Discharged Condition: good  Procedures: 01/07/15 Acute inferior ST elevation myocardial infarction due to 99% thrombus filled mid to distal RCA with reduced flow. 2. Successful PTCA and stent implantation with reduction in distal RCA stenosis to 0% and TIMI grade 3 flow by Dr. Tamala Julian.  01/08/15 . 80% LCX with staged PCI-Successful stenting of the proximal LCx with a DES by Dr. Martinique.  Hospital Course: 54 year old African-American female with hypertension, tobacco abuse who presented with new onset of back pain for about 2 days prior to presentation. Her back pain was significantly worse at around 10 PM on 01/06/2015, she vomited and had shortness of breath. Ambulance was called who noted ST elevation MI. Patient was emergently transferred to [facility] where she underwent mid RCA PCI, she was also found to have 80% LCX stenosis with plans for staged intervention.  Patient denied active symptoms of chest pain, shortness of breath, PND orthopnea, lower extremity edema, frequent or prolonged palpitations, lower extremity edema, nausea vomiting after the cath.    Pt was stable post procedure and then underwent staged PCI to LCX.  By 01/09/15 pt had minimal back pain, and ambulated with cardiac rehab without complications.  She did have some wheezes and CXR was done- mild CHF, but she is now back on HCTZ which should resolve the mild CHF, pt was not DOE.  Pt would like to do rehab at [facility]  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 She will be seen in the office in 5-7 days.  No driving for 5 days.  She is borderline diabetic and dietician will see prior to discharge.  She is on BB and is on ARB.  we added high dose statin.  She has NTG sl. Prn.    Affect is flat at discharge.     PLEASE NOTE- Pt cannot afford Brilinta, she did receive 30 day free card.  We were not made aware of her high copay until just before discharge.  As she has enough for 1 month and she will see Dr. Karl Bales in 5-7 days we will ask him to change to Effient as an outpt.     Consults: cardiology  Significant Diagnostic Studies:  BMET    Component Value Date/Time   NA 139 01/09/2015 0126   K 3.9 01/09/2015 0126   CL 105 01/09/2015 0126   CO2 27 01/09/2015 0126   GLUCOSE 99 01/09/2015 0126   BUN 10 01/09/2015 0126   CREATININE 0.78 01/09/2015 0126   CALCIUM 8.8 01/09/2015 0126   GFRNONAA >90 01/09/2015 0126   GFRAA >90 01/09/2015 0126    CBC    Component Value Date/Time   WBC 10.3 01/09/2015 0126   RBC 5.12* 01/09/2015 0126   HGB 14.0 01/09/2015 0126   HCT 41.6 01/09/2015 0126   PLT 205 01/09/2015 0126   MCV 81.3 01/09/2015 0126   MCH 27.3 01/09/2015 0126   MCHC 33.7 01/09/2015 0126   RDW 14.3 01/09/2015 0126  Hepatic Function Latest Ref Rng 01/07/2015  Total Protein 6.0 - 8.3 g/dL 7.2  Albumin 3.5 - 5.2 g/dL 3.4(L)  AST 0 - 37 U/L 69(H)  ALT 0 - 35 U/L 18  Alk Phosphatase 39 - 117 U/L 119(H)  Total Bilirubin 0.3 - 1.2 mg/dL 0.5   Troponin Pk of 25.41 and decreasing at discharge to 6.02.  Lipid Panel     Component Value Date/Time   CHOL 221* 01/07/2015 0226   TRIG 65 01/07/2015 0226   HDL 52 01/07/2015 0226   CHOLHDL 4.3 01/07/2015 0226   VLDL 13 01/07/2015 0226   LDLCALC 156* 01/07/2015 0226   HgBA1C 6.0  TSH 1.212  EKG: NSR Left ventricular hypertrophy with repolarization abnormality Possible Inferior infarct , age undetermined Abnormal  ECG No significant change since last tracing   2 VIEW CXR: CHEST 2 VIEW COMPARISON: No priors. FINDINGS: Mild cephalization of the pulmonary vasculature. Lung volumes are normal. No consolidative airspace disease. No pleural effusions. Mild indistinctness of interstitial markings and fluid in the minor fissure. Heart size appears borderline enlarged. Upper mediastinal contours are within normal limits. IMPRESSION: 1. The appearance the chest suggests very mild congestive heart failure, as above.  01/07/15 CARDIAC CATH and PCI: for STEMI  HEMODYNAMICS: Aortic pressure 126/80 mmHg; LV pressure 126/19 mmHg; LVEDP 32 mmHg  ANGIOGRAPHIC DATA: The left main coronary artery is widely patent. It branches into a LAD and diagonal..  The left anterior descending artery is a large vessel that wraps around the left ventricular apex. It gives origin to a diagonal. The LAD at this bifurcation with the diagonal contains 40% narrowing. The proximal LAD at the left main contains 30% narrowing. The LAD is tortuous throughout its course..  The left circumflex artery is a moderate size vessel that gives origin to 2 obtuse marginal branches. After the first obtuse marginal there is an eccentric 80% stenosis before the second obtuse marginal trifurcates on the inferolateral wall. This stenosis seen only in the LAO caudal projections..  The right coronary artery is dominant and the culprit vessel with a mid to distal 99% thrombus filled stenosis.   PCI RESULTS: The 99% thrombus filled stenosis is reduced to 0% with TIMI grade 3 flow after placing a 20 x 2.75% Synergy DES postdilated to 3.0 mm in diameter at 14 atm. TIMI grade 3 flow is noted. Myocardial blush is noted.  LEFT VENTRICULOGRAM: Left ventricular angiogram was done in the 30 RAO projection and revealed 70% without obvious regional wall motion abnormality.   IMPRESSIONS: 1. Acute inferior ST elevation myocardial infarction due to 99%  thrombus filled mid to distal RCA with reduced flow. 2. Successful PTCA and stent implantation with reduction in distal RCA stenosis to 0% and TIMI grade 3 flow. 3. Residual eccentric 80% mid circumflex stenosis. 4. Widely patent LAD 5. Overall normal LV function with markedly elevated end-diastolic pressures consistent with acute on chronic diastolic heart failure.  CARDIAC CATH AND PCI 01/08/15 Lesion Data: Vessel: LCx Percent stenosis (pre): 80-90% TIMI-flow (pre): 3 Stent: 3.0 x 16 mm Synergy Percent stenosis (post): 0% TIMI-flow (post): 3  Conclusions:  1. Successful stenting of the proximal LCx with a DES   Discharge Exam: Blood pressure 147/74, pulse 69, temperature 97.7 F (36.5 C), temperature source Oral, resp. rate 18, height 5' 2.5" (1.588 m), weight 197 lb 15.6 oz (89.8 kg), SpO2 98 %.    Disposition: Home      Discharge Instructions    Amb Referral to Cardiac Rehabilitation  Complete by:  As directed   To [place]            Medication List    STOP taking these medications        diltiazem 240 MG 24 hr capsule  Commonly known as:  DILACOR XR      TAKE these medications        acetaminophen 325 MG tablet  Commonly known as:  TYLENOL  Take 2 tablets (650 mg total) by mouth every 4 (four) hours as needed for headache or mild pain.     aspirin 81 MG chewable tablet  Chew 1 tablet (81 mg total) by mouth daily.     atorvastatin 80 MG tablet  Commonly known as:  LIPITOR  Take 1 tablet (80 mg total) by mouth daily at 6 PM.     losartan-hydrochlorothiazide 50-12.5 MG per tablet  Commonly known as:  HYZAAR  Take 1 tablet by mouth daily.     metoprolol tartrate 25 MG tablet  Commonly known as:  LOPRESSOR  Take 1 tablet (25 mg total) by mouth 2 (two) times daily.     nicotine 14 mg/24hr patch  Commonly known as:  NICODERM CQ - dosed in mg/24 hours  Place 1 patch (14 mg total) onto the skin daily.     ticagrelor 90 MG Tabs tablet  Commonly  known as:  BRILINTA  Take 1 tablet (90 mg total) by mouth 2 (two) times daily.     vitamin B-12 250 MCG tablet  Commonly known as:  CYANOCOBALAMIN  Take 250 mcg by mouth daily.          Discharge Instructions: Call [facility] at [phone] if any bleeding, swelling or drainage at cath site.  May shower, no tub baths for 48 hours for groin sticks. No lifting over 5 pounds for 5 days.  No Driving for 5 days.  No work for 2 weeks. You have a work note for your employer.   Exercise as instructed by cardiac rehab.  Take 1 NTG, under your tongue, while sitting.  If no relief of pain may repeat NTG, one tab every 5 minutes up to 3 tablets total over 15 minutes.  If no relief CALL 911.  If you have dizziness/lightheadness  while taking NTG, stop taking and call 911.        You are several new medications.  Do not stop Brilinta or asprin, these keep your stents open.  You are borderline diabetic, stop sweets when possible.  Use whole wheat bread instead of white bread, decrease breads overall.  Follow up with your primary MD concerning your borderline diabetes.  Stop smoking, we have ordered nicoderm patches.   Call if you have questions.      Signed: Isaiah Serge Nurse Practitioner-Certified [facility]: [facility] 01/09/2015, 10:40 AM  Time spent on discharge : > 30 minutes.    I have examined the patient and reviewed assessment and plan and discussed with patient.  Agree with above as stated.  No bleeding at the radial site.  Out of work for 2 weeks.    Continue DAPT.  OK to d/c if she does well walking with cardiac rehab.  High dose lipitor.  All questions answered.

## 2015-01-18 ENCOUNTER — Ambulatory Visit (INDEPENDENT_AMBULATORY_CARE_PROVIDER_SITE_OTHER): Payer: Managed Care, Other (non HMO) | Admitting: Cardiovascular Disease

## 2015-01-18 ENCOUNTER — Encounter: Payer: Self-pay | Admitting: Cardiovascular Disease

## 2015-01-18 VITALS — BP 110/62 | HR 55 | Ht 62.5 in | Wt 203.2 lb

## 2015-01-18 DIAGNOSIS — E785 Hyperlipidemia, unspecified: Secondary | ICD-10-CM

## 2015-01-18 DIAGNOSIS — I2119 ST elevation (STEMI) myocardial infarction involving other coronary artery of inferior wall: Secondary | ICD-10-CM

## 2015-01-18 DIAGNOSIS — E1159 Type 2 diabetes mellitus with other circulatory complications: Secondary | ICD-10-CM

## 2015-01-18 DIAGNOSIS — I2510 Atherosclerotic heart disease of native coronary artery without angina pectoris: Secondary | ICD-10-CM

## 2015-01-18 DIAGNOSIS — Z720 Tobacco use: Secondary | ICD-10-CM

## 2015-01-18 DIAGNOSIS — I10 Essential (primary) hypertension: Secondary | ICD-10-CM

## 2015-01-18 NOTE — Assessment & Plan Note (Signed)
We have encouraged continued exercise, careful diet management in an effort to lose weight. 

## 2015-01-18 NOTE — Patient Instructions (Addendum)
You are doing well. No medication changes were made.  Lab work in 3 months, cholesterol  Please call us if you have new issues that need to be addressed before your next appt.  Your physician wants you to follow-up in: 6 months.  You will receive a reminder letter in the mail two months in advance. If you don't receive a letter, please call our office to schedule the follow-up appointment.

## 2015-01-18 NOTE — Assessment & Plan Note (Signed)
We have encouraged her to continue to work on weaning her cigarettes and smoking cessation. She will continue to work on this and does not want any assistance with chantix.  

## 2015-01-18 NOTE — Assessment & Plan Note (Signed)
Currently with no symptoms of angina. No further workup at this time. Continue current medication regimen. 

## 2015-01-18 NOTE — Progress Notes (Signed)
Patient ID: Vanessa Snow, female    DOB: Nov 03, 1961, 54 y.o.   MRN: [id number]  HPI Comments: Vanessa Snow is a 54 year old woman with history of chronic back pain, smoking, hyperlipidemia, hypertension who presents to establish care in the [place] [facility]. She had recent admission to the hospital for STEMI, subtotally occluded RCA in the mid to distal region also with left circumflex disease. On 01/07/2015 she had DES to her RCA. On January 25th 2016 she had DES to her left circumflex. She has residual 30 and 40% proximal and mid LAD disease. Peak troponin of 6.  LV gram showing no wall motion abnormality.  In follow-up today, she is doing well overall. Still feels nervous about her recent MI. She is tolerating her medications but is concerned about the cost of the Brilinta.  She reports blood pressure has been relatively stable. She has started walking. She does not want cardiac rehabilitation. No significant back pain. This was her previous anginal equivalent. She does report having some back pain that seems to stutter prior to her MI. It was difficult to tell what was musculoskeletal and what was cardiac  EKG on today's visit shows normal sinus rhythm with rate 58 bpm, T-wave abnormality in lead V6, inferior leads     Allergies  Allergen Reactions  . Ace Inhibitors Cough  . Sulfa Antibiotics Other (See Comments)    Severe headaches     Outpatient Encounter Prescriptions as of 01/18/2015  Medication Sig  . acetaminophen (TYLENOL) 325 MG tablet Take 2 tablets (650 mg total) by mouth every 4 (four) hours as needed for headache or mild pain.  Marland Kitchen. aspirin 81 MG chewable tablet Chew 1 tablet (81 mg total) by mouth daily.  Marland Kitchen. atorvastatin (LIPITOR) 80 MG tablet Take 1 tablet (80 mg total) by mouth daily at 6 PM.  . losartan-hydrochlorothiazide (HYZAAR) 50-12.5 MG per tablet Take 1 tablet by mouth daily.  . metoprolol tartrate (LOPRESSOR) 25 MG tablet Take 1 tablet (25 mg  total) by mouth 2 (two) times daily.  . nicotine (NICODERM CQ - DOSED IN MG/24 HOURS) 14 mg/24hr patch Place 1 patch (14 mg total) onto the skin daily.  . nitroGLYCERIN (NITROSTAT) 0.4 MG SL tablet Place 1 tablet (0.4 mg total) under the tongue every 5 (five) minutes as needed for chest pain.  . ticagrelor (BRILINTA) 90 MG TABS tablet Take 1 tablet (90 mg total) by mouth 2 (two) times daily.  . vitamin B-12 (CYANOCOBALAMIN) 250 MCG tablet Take 250 mcg by mouth daily.    Past Medical History  Diagnosis Date  . Hypertension   . Myocardial infarction     STEMI  . Hyperlipidemia LDL goal <70 01/09/2015  . DM2 (diabetes mellitus, type 2) 01/09/2015  . CAD, multiple vessel, STEMI-DES-RCA 01/07/15, pLCX DES-01/08/15 01/09/2015    Past Surgical History  Procedure Laterality Date  . Left heart catheterization with coronary angiogram N/A 01/07/2015    Procedure: LEFT HEART CATHETERIZATION WITH CORONARY ANGIOGRAM;  Surgeon: Lesleigh NoeHenry W Smith III, MD;  Location: [facility];  Service: Cardiovascular;  Laterality: N/A;  . Coronary stent placement  01/07/2015    rca  DES, STEMI  . Tubal ligation    . Percutaneous coronary stent intervention (pci-s) N/A 01/08/2015    Procedure: PERCUTANEOUS CORONARY STENT INTERVENTION (PCI-S);  Surgeon: Peter M SwazilandJordan, MD;  [facility];  Service: Cardiovascular;  Laterality: N/A;  . Cardiac catheterization  01/08/2015    stent to LCX, synergy DES  . Coronary angioplasty  stent to LCX, synergy DES    Social History  reports that she quit smoking 11 days ago. Her smoking use included Cigarettes. She has a 15 pack-year smoking history. She has never used smokeless tobacco. She reports that she does not drink alcohol or use illicit drugs.  Family History Family history is unknown by patient.  Review of Systems  Constitutional: Negative.   Respiratory: Negative.   Cardiovascular: Negative.   Gastrointestinal: Negative.   Musculoskeletal: Negative.   Skin:  Negative.   Neurological: Negative.   Hematological: Negative.   Psychiatric/Behavioral: The patient is nervous/anxious.   All other systems reviewed and are negative.   BP 110/62 mmHg  Pulse 55  Ht 5' 2.5" (1.588 m)  Wt 203 lb 4 oz (92.194 kg)  BMI 36.56 kg/m2  Physical Exam  Constitutional: She is oriented to person, place, and time. She appears well-developed and well-nourished.  HENT:  Head: Normocephalic.  Nose: Nose normal.  Mouth/Throat: Oropharynx is clear and moist.  Eyes: Conjunctivae are normal. Pupils are equal, round, and reactive to light.  Neck: Normal range of motion. Neck supple. No JVD present.  Cardiovascular: Normal rate, regular rhythm, S1 normal, S2 normal, normal heart sounds and intact distal pulses.  Exam reveals no gallop and no friction rub.   No murmur heard. Pulmonary/Chest: Effort normal and breath sounds normal. No respiratory distress. She has no wheezes. She has no rales. She exhibits no tenderness.  Abdominal: Soft. Bowel sounds are normal. She exhibits no distension. There is no tenderness.  Musculoskeletal: Normal range of motion. She exhibits no edema or tenderness.  Lymphadenopathy:    She has no cervical adenopathy.  Neurological: She is alert and oriented to person, place, and time. Coordination normal.  Skin: Skin is warm and dry. No rash noted. No erythema.  Psychiatric: She has a normal mood and affect. Her behavior is normal. Judgment and thought content normal.    Assessment and Plan  Nursing note and vitals reviewed.

## 2015-01-18 NOTE — Assessment & Plan Note (Signed)
Blood pressure is well controlled on today's visit. No changes made to the medications. 

## 2015-01-18 NOTE — Assessment & Plan Note (Signed)
We will schedule repeat lipid panel in 3 months time

## 2015-02-19 ENCOUNTER — Other Ambulatory Visit: Payer: Self-pay | Admitting: *Deleted

## 2015-02-19 MED ORDER — TICAGRELOR 90 MG PO TABS: 90.0000 mg | ORAL_TABLET | Freq: Two times a day (BID) | ORAL | Status: DC

## 2015-02-19 NOTE — Telephone Encounter (Signed)
erx sent for 90 day supply of brilinta

## 2015-02-23 ENCOUNTER — Telehealth: Payer: Self-pay

## 2015-02-23 NOTE — Telephone Encounter (Signed)
Pt would like Brilinta samples 

## 2015-02-23 NOTE — Telephone Encounter (Signed)
Samples available to pick up for eliquis 90 mg

## 2015-03-19 ENCOUNTER — Telehealth: Payer: Self-pay | Admitting: *Deleted

## 2015-03-19 NOTE — Telephone Encounter (Signed)
She had stent placed January 2016, drug-coated Both aspirin and brilinta are needed to prevent clotting on the stent Would not stop either In one year, January 2017, we can decrease the dose of the bilinta but not until then

## 2015-03-19 NOTE — Telephone Encounter (Signed)
Pt is having some busing around body Especially on the arms.  Please advise

## 2015-03-19 NOTE — Telephone Encounter (Signed)
Spoke w/ Vanessa Snow.  She reports bruising on her arms for the past week.  She is concerned that it may be r/t Brilinta and/or aspirin.  She would like to know if she should stop or reduce one or both of these. She denies any other symptoms.  Advised her that I will make Dr. Mariah MillingGollan aware and for her not to stop either of these meds until she hears back from us.  She is appreciative and verbalizes understanding.

## 2015-03-20 NOTE — Telephone Encounter (Signed)
Left message for pt to call back  °

## 2015-03-20 NOTE — Telephone Encounter (Signed)
Spoke w/ pt.  Advised her of Dr. Windell HummingbirdGollan's recommendation.  She verbalizes understanding and states that she "feels better hearing that". Asked her to call back w/ any further questions or concerns.

## 2015-03-20 NOTE — Telephone Encounter (Signed)
Pt returned your call.  

## 2015-05-26 ENCOUNTER — Encounter (HOSPITAL_COMMUNITY): Payer: Self-pay | Admitting: *Deleted

## 2015-08-18 ENCOUNTER — Other Ambulatory Visit: Payer: Self-pay | Admitting: Cardiology

## 2015-09-17 ENCOUNTER — Other Ambulatory Visit: Payer: Self-pay | Admitting: Cardiology

## 2015-10-17 ENCOUNTER — Telehealth: Payer: Self-pay | Admitting: Cardiovascular Disease

## 2015-10-17 NOTE — Telephone Encounter (Signed)
3 attempts to schedule OD fu from Recall. Lmov to schedule with office      Deleting recall.

## 2015-10-31 ENCOUNTER — Ambulatory Visit (INDEPENDENT_AMBULATORY_CARE_PROVIDER_SITE_OTHER): Payer: Managed Care, Other (non HMO) | Admitting: Cardiovascular Disease

## 2015-10-31 ENCOUNTER — Encounter: Payer: Self-pay | Admitting: Cardiovascular Disease

## 2015-10-31 VITALS — BP 110/82 | HR 65 | Ht 62.5 in | Wt 208.5 lb

## 2015-10-31 DIAGNOSIS — Z720 Tobacco use: Secondary | ICD-10-CM

## 2015-10-31 DIAGNOSIS — E785 Hyperlipidemia, unspecified: Secondary | ICD-10-CM

## 2015-10-31 DIAGNOSIS — I2510 Atherosclerotic heart disease of native coronary artery without angina pectoris: Secondary | ICD-10-CM

## 2015-10-31 DIAGNOSIS — E1159 Type 2 diabetes mellitus with other circulatory complications: Secondary | ICD-10-CM | POA: Diagnosis not present

## 2015-10-31 DIAGNOSIS — I10 Essential (primary) hypertension: Secondary | ICD-10-CM

## 2015-10-31 DIAGNOSIS — I2119 ST elevation (STEMI) myocardial infarction involving other coronary artery of inferior wall: Secondary | ICD-10-CM

## 2015-10-31 MED ORDER — TICAGRELOR 90 MG PO TABS: 90.0000 mg | ORAL_TABLET | Freq: Two times a day (BID) | ORAL | Status: DC

## 2015-10-31 MED ORDER — LOSARTAN POTASSIUM-HCTZ 50-12.5 MG PO TABS: 1.0000 | ORAL_TABLET | Freq: Every day | ORAL | Status: DC

## 2015-10-31 MED ORDER — METOPROLOL TARTRATE 25 MG PO TABS: 25.0000 mg | ORAL_TABLET | Freq: Two times a day (BID) | ORAL | Status: DC

## 2015-10-31 MED ORDER — ATORVASTATIN CALCIUM 80 MG PO TABS: 80.0000 mg | ORAL_TABLET | Freq: Every day | ORAL | Status: DC

## 2015-10-31 NOTE — Assessment & Plan Note (Signed)
She reports that she will have routine blood work with primary care in January 2017 Goal LDL less than 70 If she does not meet goal, could change to Crestor +/- zetia

## 2015-10-31 NOTE — Assessment & Plan Note (Signed)
Results discussed with her in detail. No further workup at this time. Recommended a regular exercise program Continue on antiplatelet medication

## 2015-10-31 NOTE — Assessment & Plan Note (Signed)
Recommended that she stay on her current medications. Samples of brilinta provided. This is very expensive for her

## 2015-10-31 NOTE — Assessment & Plan Note (Signed)
Blood pressure is well controlled on today's visit. No changes made to the medications. 

## 2015-10-31 NOTE — Assessment & Plan Note (Signed)
Stressed importance of weight loss We have encouraged continued exercise, careful diet management

## 2015-10-31 NOTE — Assessment & Plan Note (Signed)
We have encouraged her to continue to work on weaning her cigarettes and smoking cessation. She will continue to work on this and does not want any assistance with chantix.  

## 2015-10-31 NOTE — Patient Instructions (Addendum)
You are doing well. No medication changes were made.  Try to quit smoking!  Please call us if you have new issues that need to be addressed before your next appt.  Your physician wants you to follow-up in: 6 months.  You will receive a reminder letter in the mail two months in advance. If you don't receive a letter, please call our office to schedule the follow-up appointment.  Steps to Quit Smoking  Smoking tobacco can be harmful to your health and can affect almost every organ in your body. Smoking puts you, and those around you, at risk for developing many serious chronic diseases. Quitting smoking is difficult, but it is one of the best things that you can do for your health. It is never too late to quit. WHAT ARE THE BENEFITS OF QUITTING SMOKING? When you quit smoking, you lower your risk of developing serious diseases and conditions, such as:  Lung cancer or lung disease, such as COPD.  Heart disease.  Stroke.  Heart attack.  Infertility.  Osteoporosis and bone fractures. Additionally, symptoms such as coughing, wheezing, and shortness of breath may get better when you quit. You may also find that you get sick less often because your body is stronger at fighting off colds and infections. If you are pregnant, quitting smoking can help to reduce your chances of having a baby of low birth weight. HOW DO I GET READY TO QUIT? When you decide to quit smoking, create a plan to make sure that you are successful. Before you quit:  Pick a date to quit. Set a date within the next two weeks to give you time to prepare.  Write down the reasons why you are quitting. Keep this list in places where you will see it often, such as on your bathroom mirror or in your car or wallet.  Identify the people, places, things, and activities that make you want to smoke (triggers) and avoid them. Make sure to take these actions:  Throw away all cigarettes at home, at work, and in your car.  Throw away  smoking accessories, such as Set designer.  Clean your car and make sure to empty the ashtray.  Clean your home, including curtains and carpets.  Tell your family, friends, and coworkers that you are quitting. Support from your loved ones can make quitting easier.  Talk with your health care provider about your options for quitting smoking.  Find out what treatment options are covered by your health insurance. WHAT STRATEGIES CAN I USE TO QUIT SMOKING?  Talk with your healthcare provider about different strategies to quit smoking. Some strategies include:  Quitting smoking altogether instead of gradually lessening how much you smoke over a period of time. Research shows that quitting "cold [place]" is more successful than gradually quitting.  Attending in-person counseling to help you build problem-solving skills. You are more likely to have success in quitting if you attend several counseling sessions. Even short sessions of 10 minutes can be effective.  Finding resources and support systems that can help you to quit smoking and remain smoke-free after you quit. These resources are most helpful when you use them often. They can include:  Online chats with a Veterinary surgeon.  Telephone quitlines.  Printed Materials engineer.  Support groups or group counseling.  Text messaging programs.  Mobile phone applications.  Taking medicines to help you quit smoking. (If you are pregnant or breastfeeding, talk with your health care provider first.) Some medicines contain nicotine and  some do not. Both types of medicines help with cravings, but the medicines that include nicotine help to relieve withdrawal symptoms. Your health care provider may recommend:  Nicotine patches, gum, or lozenges.  Nicotine inhalers or sprays.  Non-nicotine medicine that is taken by mouth. Talk with your health care provider about combining strategies, such as taking medicines while you are also receiving  in-person counseling. Using these two strategies together makes you more likely to succeed in quitting than if you used either strategy on its own. If you are pregnant or breastfeeding, talk with your health care provider about finding counseling or other support strategies to quit smoking. Do not take medicine to help you quit smoking unless told to do so by your health care provider. WHAT THINGS CAN I DO TO MAKE IT EASIER TO QUIT? Quitting smoking might feel overwhelming at first, but there is a lot that you can do to make it easier. Take these important actions:  Reach out to your family and friends and ask that they support and encourage you during this time. Call telephone quitlines, reach out to support groups, or work with a counselor for support.  Ask people who smoke to avoid smoking around you.  Avoid places that trigger you to smoke, such as bars, parties, or smoke-break areas at work.  Spend time around people who do not smoke.  Lessen stress in your life, because stress can be a smoking trigger for some people. To lessen stress, try:  Exercising regularly.  Deep-breathing exercises.  Yoga.  Meditating.  Performing a body scan. This involves closing your eyes, scanning your body from head to toe, and noticing which parts of your body are particularly tense. Purposefully relax the muscles in those areas.  Download or purchase mobile phone or tablet apps (applications) that can help you stick to your quit plan by providing reminders, tips, and encouragement. There are many free apps, such as QuitGuide from the [employer] developer for Disease Control and Prevention). You can find other support for quitting smoking (smoking cessation) through smokefree.gov and other websites. HOW WILL I FEEL WHEN I QUIT SMOKING? Within the first 24 hours of quitting smoking, you may start to feel some withdrawal symptoms. These symptoms are usually most noticeable 2-3 days after quitting, but they usually  do not last beyond 2-3 weeks. Changes or symptoms that you might experience include:  Mood swings.  Restlessness, anxiety, or irritation.  Difficulty concentrating.  Dizziness.  Strong cravings for sugary foods in addition to nicotine.  Mild weight gain.  Constipation.  Nausea.  Coughing or a sore throat.  Changes in how your medicines work in your body.  A depressed mood.  Difficulty sleeping (insomnia). After the first 2-3 weeks of quitting, you may start to notice more positive results, such as:  Improved sense of smell and taste.  Decreased coughing and sore throat.  Slower heart rate.  Lower blood pressure.  Clearer skin.  The ability to breathe more easily.  Fewer sick days. Quitting smoking is very challenging for most people. Do not get discouraged if you are not successful the first time. Some people need to make many attempts to quit before they achieve long-term success. Do your best to stick to your quit plan, and talk with your health care provider if you have any questions or concerns.   This information is not intended to replace advice given to you by your health care provider. Make sure you discuss any questions you have with your  health care provider.   Document Released: 11/25/2001 Document Revised: 04/17/2015 Document Reviewed: 04/17/2015 Elsevier Interactive Patient Education 2016 [employer]. Smoking Hazards Smoking cigarettes is extremely bad for your health. Tobacco smoke has over 200 known poisons in it. It contains the poisonous gases nitrogen oxide and carbon monoxide. There are over 60 chemicals in tobacco smoke that cause cancer. Some of the chemicals found in cigarette smoke include:   Cyanide.   Benzene.   Formaldehyde.   Methanol (wood alcohol).   Acetylene (fuel used in welding torches).   Ammonia.  Even smoking lightly shortens your life expectancy by several years. You can greatly reduce the risk of medical  problems for you and your family by stopping now. Smoking is the most preventable cause of death and disease in our society. Within days of quitting smoking, your circulation improves, you decrease the risk of having a heart attack, and your lung capacity improves. There may be some increased phlegm in the first few days after quitting, and it may take months for your lungs to clear up completely. Quitting for 10 years reduces your risk of developing lung cancer to almost that of a nonsmoker.  WHAT ARE THE RISKS OF SMOKING? Cigarette smokers have an increased risk of many serious medical problems, including:  Lung cancer.   Lung disease (such as pneumonia, bronchitis, and emphysema).   Heart attack and chest pain due to the heart not getting enough oxygen (angina).   Heart disease and peripheral blood vessel disease.   Hypertension.   Stroke.   Oral cancer (cancer of the lip, mouth, or voice box).   Bladder cancer.   Pancreatic cancer.   Cervical cancer.   Pregnancy complications, including premature birth.   Stillbirths and smaller newborn babies, birth defects, and genetic damage to sperm.   Early menopause.   Lower estrogen level for women.   Infertility.   Facial wrinkles.   Blindness.   Increased risk of broken bones (fractures).   Senile dementia.   Stomach ulcers and internal bleeding.   Delayed wound healing and increased risk of complications during surgery. Because of secondhand smoke exposure, children of smokers have an increased risk of the following:   Sudden infant death syndrome (SIDS).   Respiratory infections.   Lung cancer.   Heart disease.   Ear infections.  WHY IS SMOKING ADDICTIVE? Nicotine is the chemical agent in tobacco that is capable of causing addiction or dependence. When you smoke and inhale, nicotine is absorbed rapidly into the bloodstream through your lungs. Both inhaled and noninhaled nicotine may be  addictive.  WHAT ARE THE BENEFITS OF QUITTING?  There are many health benefits to quitting smoking. Some are:   The likelihood of developing cancer and heart disease decreases. Health improvements are seen almost immediately.   Blood pressure, pulse rate, and breathing patterns start returning to normal soon after quitting.   People who quit may see an improvement in their overall quality of life.  HOW DO YOU QUIT SMOKING? Smoking is an addiction with both physical and psychological effects, and longtime habits can be hard to change. Your health care provider can recommend:  Programs and community resources, which may include group support, education, or therapy.  Replacement products, such as patches, gum, and nasal sprays. Use these products only as directed. Do not replace cigarette smoking with electronic cigarettes (commonly called e-cigarettes). The safety of e-cigarettes is unknown, and some may contain harmful chemicals. FOR MORE INFORMATION  American Lung Association: www.lung.org  American Cancer Society: www.cancer.org   This information is not intended to replace advice given to you by your health care provider. Make sure you discuss any questions you have with your health care provider.   Document Released: 01/08/2005 Document Revised: 09/21/2013 Document Reviewed: 05/23/2013 Elsevier Interactive Patient Education 2016 [employer] Secondhand Smoke WHAT IS SECONDHAND SMOKE? Secondhand smoke is smoke that comes from burning tobacco. It could be the smoke from a cigarette, a pipe, or a cigar. Even if you are not the one smoking, secondhand smoke exposes you to the dangers of smoking. This is called involuntary, or passive, smoking. There are two types of secondhand smoke:  Sidestream smoke is the smoke that comes off the lighted end of a cigarette, pipe, or cigar.  This type of smoke has the highest amount of cancer-causing agents (carcinogens).  The particles in  sidestream smoke are smaller. They get into your lungs more easily.  Mainstream smoke is the smoke that is exhaled by a person who is smoking.  This type of smoke is also dangerous to your health. HOW CAN SECONDHAND SMOKE AFFECT MY HEALTH? Studies show that there is no safe level of secondhand smoke. This smoke contains thousands of chemicals. At least 69 of them are known to cause cancer. Secondhand smoke can also cause many other health problems. It has been linked to:  Lung cancer.  Cancer of the voice box (larynx) or throat.  Cancer of the sinuses.  Brain cancer.  Bladder cancer.  Stomach cancer.  Breast cancer.  White blood cell cancers (lymphoma and leukemia).  Brain and liver tumors in children.  Heart disease and stroke in adults.  Pregnancy loss (miscarriage).  Diseases in children, such as:  Asthma.  Lung infections.  Ear infections.  Sudden infant death syndrome (SIDS).  Slow growth. WHERE CAN I BE AT RISK FOR EXPOSURE TO SECONDHAND SMOKE?   For adults, the workplace is the main source of exposure to secondhand smoke.  Your workplace should have a policy separating smoking areas from nonsmoking areas.  Smoking areas should have a system for ventilating and cleaning the air.  For children, the home may be the most dangerous place for exposure to secondhand smoke.  Children who live in apartment buildings may be at risk from smoke drifting from hallways or other people's homes.  For everyone, many public places are possible sources of exposure to secondhand smoke.  These places include restaurants, shopping centers, and parks. HOW CAN I REDUCE MY RISK FOR EXPOSURE TO SECONDHAND SMOKE? The most important thing you can do is not smoke. Discourage family members from smoking. Other ways to reduce exposure for you and your family include the following:  Keep your home smoke free.  Make sure your child care providers do not smoke.  Warn your child  about the dangers of smoking and secondhand smoke.  Do not allow smoking in your car. When someone smokes in a car, all the damaging chemicals from the smoke are confined in a small area.  Avoid public places where smoking is allowed.   This information is not intended to replace advice given to you by your health care provider. Make sure you discuss any questions you have with your health care provider.   Document Released: 01/08/2005 Document Revised: 12/22/2014 Document Reviewed: 03/17/2014 Elsevier Interactive Patient Education 2016 [employer] Smoking Cessation, Tips for Success If you are ready to quit smoking, congratulations! You have chosen to help yourself be healthier. Cigarettes bring nicotine, tar, carbon  monoxide, and other irritants into your body. Your lungs, heart, and blood vessels will be able to work better without these poisons. There are many different ways to quit smoking. Nicotine gum, nicotine patches, a nicotine inhaler, or nicotine nasal spray can help with physical craving. Hypnosis, support groups, and medicines help break the habit of smoking. WHAT THINGS CAN I DO TO MAKE QUITTING EASIER?  Here are some tips to help you quit for good:  Pick a date when you will quit smoking completely. Tell all of your friends and family about your plan to quit on that date.  Do not try to slowly cut down on the number of cigarettes you are smoking. Pick a quit date and quit smoking completely starting on that day.  Throw away all cigarettes.   Clean and remove all ashtrays from your home, work, and car.  On a card, write down your reasons for quitting. Carry the card with you and read it when you get the urge to smoke.  Cleanse your body of nicotine. Drink enough water and fluids to keep your urine clear or pale yellow. Do this after quitting to flush the nicotine from your body.  Learn to predict your moods. Do not let a bad situation be your excuse to have a cigarette.  Some situations in your life might tempt you into wanting a cigarette.  Never have "just one" cigarette. It leads to wanting another and another. Remind yourself of your decision to quit.  Change habits associated with smoking. If you smoked while driving or when feeling stressed, try other activities to replace smoking. Stand up when drinking your coffee. Brush your teeth after eating. Sit in a different chair when you read the paper. Avoid alcohol while trying to quit, and try to drink fewer caffeinated beverages. Alcohol and caffeine may urge you to smoke.  Avoid foods and drinks that can trigger a desire to smoke, such as sugary or spicy foods and alcohol.  Ask people who smoke not to smoke around you.  Have something planned to do right after eating or having a cup of coffee. For example, plan to take a walk or exercise.  Try a relaxation exercise to calm you down and decrease your stress. Remember, you may be tense and nervous for the first 2 weeks after you quit, but this will pass.  Find new activities to keep your hands busy. Play with a pen, coin, or rubber band. Doodle or draw things on paper.  Brush your teeth right after eating. This will help cut down on the craving for the taste of tobacco after meals. You can also try mouthwash.   Use oral substitutes in place of cigarettes. Try using lemon drops, carrots, cinnamon sticks, or chewing gum. Keep them handy so they are available when you have the urge to smoke.  When you have the urge to smoke, try deep breathing.  Designate your home as a nonsmoking area.  If you are a heavy smoker, ask your health care provider about a prescription for nicotine chewing gum. It can ease your withdrawal from nicotine.  Reward yourself. Set aside the cigarette money you save and buy yourself something nice.  Look for support from others. Join a support group or smoking cessation program. Ask someone at home or at work to help you with your  plan to quit smoking.  Always ask yourself, "Do I need this cigarette or is this just a reflex?" Tell yourself, "Today, I choose not to  smoke," or "I do not want to smoke." You are reminding yourself of your decision to quit.  Do not replace cigarette smoking with electronic cigarettes (commonly called e-cigarettes). The safety of e-cigarettes is unknown, and some may contain harmful chemicals.  If you relapse, do not give up! Plan ahead and think about what you will do the next time you get the urge to smoke. HOW WILL I FEEL WHEN I QUIT SMOKING? You may have symptoms of withdrawal because your body is used to nicotine (the addictive substance in cigarettes). You may crave cigarettes, be irritable, feel very hungry, cough often, get headaches, or have difficulty concentrating. The withdrawal symptoms are only temporary. They are strongest when you first quit but will go away within 10-14 days. When withdrawal symptoms occur, stay in control. Think about your reasons for quitting. Remind yourself that these are signs that your body is healing and getting used to being without cigarettes. Remember that withdrawal symptoms are easier to treat than the major diseases that smoking can cause.  Even after the withdrawal is over, expect periodic urges to smoke. However, these cravings are generally short lived and will go away whether you smoke or not. Do not smoke! WHAT RESOURCES ARE AVAILABLE TO HELP ME QUIT SMOKING? Your health care provider can direct you to community resources or hospitals for support, which may include:  Group support.  Education.  Hypnosis.  Therapy.   This information is not intended to replace advice given to you by your health care provider. Make sure you discuss any questions you have with your health care provider.   Document Released: 08/29/2004 Document Revised: 12/22/2014 Document Reviewed: 05/19/2013 Elsevier Interactive Patient Education [employer]

## 2015-10-31 NOTE — Progress Notes (Signed)
Patient ID: Vanessa Snow, female    DOB: 1961/05/05, 54 y.o.   MRN: [id number]  HPI Comments: Vanessa Snow is a 54 year old woman with history of chronic back pain, smoking, hyperlipidemia, hypertension who presents for routine follow-up of her coronary artery disease She had recent admission to the hospital for STEMI, subtotally occluded RCA in the mid to distal region also with left circumflex disease. On 01/07/2015 she had DES to her RCA. On January 25th 2016 she had DES to her left circumflex. She has residual 30 and 40% proximal and mid LAD disease. Peak troponin of 6.  LV gram showing no wall motion abnormality.  In follow-up today, she reports that she continues to smoke one pack per day No regular exercise program. Weight continues to be a problem Denies having diabetes though hemoglobin A1c is borderline elevated No chest pain on exertion, overall feels well No lower extremity swelling  EKG on today's visit shows normal sinus rhythm with rate 66 beats per minute, T-wave abnormality inferior leads, improved from prior EKG, no T-wave abnormality in V6    Allergies  Allergen Reactions  . Ace Inhibitors Cough  . Sulfa Antibiotics Other (See Comments)    Severe headaches     Outpatient Encounter Prescriptions as of 10/31/2015  Medication Sig  . acetaminophen (TYLENOL) 325 MG tablet Take 2 tablets (650 mg total) by mouth every 4 (four) hours as needed for headache or mild pain.  Marland Kitchen aspirin 81 MG chewable tablet Chew 1 tablet (81 mg total) by mouth daily.  Marland Kitchen atorvastatin (LIPITOR) 80 MG tablet Take 1 tablet (80 mg total) by mouth daily at 6 PM.  . Cyanocobalamin (VITAMIN B-12) 2500 MCG SUBL Place under the tongue daily.  Marland Kitchen losartan-hydrochlorothiazide (HYZAAR) 50-12.5 MG tablet Take 1 tablet by mouth daily.  . metoprolol tartrate (LOPRESSOR) 25 MG tablet Take 1 tablet (25 mg total) by mouth 2 (two) times daily.  . nitroGLYCERIN (NITROSTAT) 0.4 MG SL tablet Place 1  tablet (0.4 mg total) under the tongue every 5 (five) minutes as needed for chest pain.  . ticagrelor (BRILINTA) 90 MG TABS tablet Take 1 tablet (90 mg total) by mouth 2 (two) times daily.  . [DISCONTINUED] atorvastatin (LIPITOR) 80 MG tablet Take 1 tablet (80 mg total) by mouth daily at 6 PM. Appointment needed for future refills  . [DISCONTINUED] losartan-hydrochlorothiazide (HYZAAR) 50-12.5 MG per tablet Take 1 tablet by mouth daily.  . [DISCONTINUED] metoprolol tartrate (LOPRESSOR) 25 MG tablet TAKE 1 TABLET (25 MG TOTAL) BY MOUTH 2 (TWO) TIMES DAILY. APPOINTMENT NEEDED FOR FUTURE REFILLS  . [DISCONTINUED] ticagrelor (BRILINTA) 90 MG TABS tablet Take 1 tablet (90 mg total) by mouth 2 (two) times daily.  . [DISCONTINUED] nicotine (NICODERM CQ - DOSED IN MG/24 HOURS) 14 mg/24hr patch Place 1 patch (14 mg total) onto the skin daily. (Patient not taking: Reported on 10/31/2015)  . [DISCONTINUED] vitamin B-12 (CYANOCOBALAMIN) 250 MCG tablet Take 250 mcg by mouth daily.   No facility-administered encounter medications on file as of 10/31/2015.    Past Medical History  Diagnosis Date  . Hypertension   . Myocardial infarction [facility])     STEMI  . Hyperlipidemia LDL goal <70 01/09/2015  . DM2 (diabetes mellitus, type 2) (HCC) 01/09/2015  . CAD, multiple vessel, STEMI-DES-RCA 01/07/15, pLCX DES-01/08/15 01/09/2015    Past Surgical History  Procedure Laterality Date  . Left heart catheterization with coronary angiogram N/A 01/07/2015    Procedure: LEFT HEART CATHETERIZATION WITH CORONARY ANGIOGRAM;  Surgeon: Sherilyn Cooter  Jamey ReasW Smith III, MD;  [facility];  Service: Cardiovascular;  Laterality: N/A;  . Coronary stent placement  01/07/2015    rca  DES, STEMI  . Tubal ligation    . Percutaneous coronary stent intervention (pci-s) N/A 01/08/2015    Procedure: PERCUTANEOUS CORONARY STENT INTERVENTION (PCI-S);  Surgeon: Peter M SwazilandJordan, MD;  [facility];  Service: Cardiovascular;  Laterality: N/A;   . Cardiac catheterization  01/08/2015    stent to LCX, synergy DES  . Coronary angioplasty      stent to LCX, synergy DES    Social History  reports that she has been smoking Cigarettes.  She has a 15 pack-year smoking history. She has never used smokeless tobacco. She reports that she does not drink alcohol or use illicit drugs.  Family History Family history is unknown by patient.  Review of Systems  Constitutional: Negative.   Respiratory: Negative.   Cardiovascular: Negative.   Gastrointestinal: Negative.   Musculoskeletal: Negative.   Skin: Negative.   Neurological: Negative.   Hematological: Negative.   Psychiatric/Behavioral: Negative.   All other systems reviewed and are negative.   BP 110/82 mmHg  Pulse 65  Ht 5' 2.5" (1.588 m)  Wt 208 lb 8 oz (94.575 kg)  BMI 37.50 kg/m2  Physical Exam  Constitutional: She is oriented to person, place, and time. She appears well-developed and well-nourished.  HENT:  Head: Normocephalic.  Nose: Nose normal.  Mouth/Throat: Oropharynx is clear and moist.  Eyes: Conjunctivae are normal. Pupils are equal, round, and reactive to light.  Neck: Normal range of motion. Neck supple. No JVD present.  Cardiovascular: Normal rate, regular rhythm, S1 normal, S2 normal, normal heart sounds and intact distal pulses.  Exam reveals no gallop and no friction rub.   No murmur heard. Pulmonary/Chest: Effort normal and breath sounds normal. No respiratory distress. She has no wheezes. She has no rales. She exhibits no tenderness.  Abdominal: Soft. Bowel sounds are normal. She exhibits no distension. There is no tenderness.  Musculoskeletal: Normal range of motion. She exhibits no edema or tenderness.  Lymphadenopathy:    She has no cervical adenopathy.  Neurological: She is alert and oriented to person, place, and time. Coordination normal.  Skin: Skin is warm and dry. No rash noted. No erythema.  Psychiatric: She has a normal mood and affect.  Her behavior is normal. Judgment and thought content normal.    Assessment and Plan  Nursing note and vitals reviewed.

## 2016-01-04 ENCOUNTER — Telehealth: Payer: Self-pay | Admitting: *Deleted

## 2016-01-04 MED ORDER — TICAGRELOR 90 MG PO TABS: 90.0000 mg | ORAL_TABLET | Freq: Two times a day (BID) | ORAL | Status: DC

## 2016-01-04 NOTE — Telephone Encounter (Signed)
Pharmacy calling stating she needs a new refill on Brilinta  Also that this is her new pharmacy

## 2016-01-04 NOTE — Telephone Encounter (Signed)
This encounter was created in error - please disregard.

## 2016-01-04 NOTE — Telephone Encounter (Signed)
Refill sent for the new pharmacy [facility] for Brilinta.

## 2016-01-21 ENCOUNTER — Telehealth: Payer: Self-pay | Admitting: *Deleted

## 2016-01-21 NOTE — Telephone Encounter (Signed)
*  STAT* If patient is at the pharmacy, call can be transferred to refill team.   1. Which medications need to be refilled? (please list name of each medication and dose if known) losartan-hydrochlorothiazide (HYZAAR) 50-12.5 MG tablet   2. Which pharmacy/location (including street and city if local pharmacy) is medication to be sent to? [facility]  [address]  3. Do they need a 30 day or 90 day supply? 90 day

## 2016-01-22 ENCOUNTER — Other Ambulatory Visit: Payer: Self-pay | Admitting: *Deleted

## 2016-01-22 MED ORDER — LOSARTAN POTASSIUM-HCTZ 50-12.5 MG PO TABS: 1.0000 | ORAL_TABLET | Freq: Every day | ORAL | Status: DC

## 2016-03-27 ENCOUNTER — Telehealth: Payer: Self-pay | Admitting: Cardiovascular Disease

## 2016-03-27 NOTE — Telephone Encounter (Signed)
LMOM that we have samples for Brilinta 90 mg available to pick up.

## 2016-03-27 NOTE — Telephone Encounter (Signed)
Patient calling the office for samples of medication:   1.  What medication and dosage are you requesting samples for? BRILINTA   2.  Are you currently out of this medication? Yes

## 2016-04-01 ENCOUNTER — Telehealth: Payer: Self-pay | Admitting: Cardiovascular Disease

## 2016-04-01 NOTE — Telephone Encounter (Signed)
Pt calling stating she would like a call has some questions about Brilinta   Pt c/o medication issue:  1. Name of Medication: BRILINTA  2. How are you currently taking this medication (dosage and times per day)?   3. Are you having a reaction (difficulty breathing--STAT)? No   4. What is your medication issue?  Dr Mariah MillingGollan told her that after a year she would be taken off.  Wanting to know if he still would like that. Its been a bit longer than that.

## 2016-04-01 NOTE — Telephone Encounter (Addendum)
Pt reports that she has been on Brilinta for over a year and she would like to know if she can discontinue this.  She has been taking since Jan 2016.

## 2016-04-02 NOTE — Telephone Encounter (Signed)
Reviewed Dr. Windell HummingbirdGollan's recommendations with patient and let her know that he wanted to keep her on the brilinta given her risk factors and she stated that was just great and what she wanted. She was not having periods and now she is bleeding all of the time and then abruptly hung up the phone. I was not able to determine if her comments were serious, sarcastic, or just from frustration. Attempted to call her back to discuss her bleeding issues and she did not answer the phone. Left a detailed voicemail message letting her know to please call back so that we can talk about her bleeding issues because we may can adjust the dose to help with that. I did not see any mention of bleeding issues in previous notes.

## 2016-04-02 NOTE — Telephone Encounter (Signed)
After one year, typically we decrease the brilinta down to 60 mg twice a day Given she has 2 stents, residual disease, smoking, would not stop the low-dose aspirin or brilinta.

## 2016-04-03 MED ORDER — TICAGRELOR 60 MG PO TABS: 60.0000 mg | ORAL_TABLET | Freq: Two times a day (BID) | ORAL | Status: DC

## 2016-04-03 NOTE — Telephone Encounter (Signed)
Spoke to patient again and let her know that we are decreasing her Brilinta down to 60 mg twice daily and patient [facility]. Let her know that I sent that prescription in and that if she continues to have issues bleeding to please let us know. She verbalized understanding of instructions and had no further questions at this time.

## 2016-04-22 ENCOUNTER — Other Ambulatory Visit: Payer: Self-pay | Admitting: *Deleted

## 2016-05-30 ENCOUNTER — Emergency Department: Payer: Managed Care, Other (non HMO)

## 2016-05-30 ENCOUNTER — Telehealth: Payer: Self-pay | Admitting: Cardiovascular Disease

## 2016-05-30 ENCOUNTER — Emergency Department
Admission: EM | Admit: 2016-05-30 | Discharge: 2016-05-30 | Disposition: A | Discharge status: Home / Self Care | Payer: Managed Care, Other (non HMO) | Attending: Emergency Medicine | Admitting: Emergency Medicine

## 2016-05-30 ENCOUNTER — Encounter: Payer: Self-pay | Admitting: Emergency Medicine

## 2016-05-30 DIAGNOSIS — E785 Hyperlipidemia, unspecified: Secondary | ICD-10-CM | POA: Diagnosis not present

## 2016-05-30 DIAGNOSIS — I10 Essential (primary) hypertension: Secondary | ICD-10-CM | POA: Insufficient documentation

## 2016-05-30 DIAGNOSIS — I2510 Atherosclerotic heart disease of native coronary artery without angina pectoris: Secondary | ICD-10-CM | POA: Insufficient documentation

## 2016-05-30 DIAGNOSIS — E119 Type 2 diabetes mellitus without complications: Secondary | ICD-10-CM | POA: Insufficient documentation

## 2016-05-30 DIAGNOSIS — I252 Old myocardial infarction: Secondary | ICD-10-CM | POA: Insufficient documentation

## 2016-05-30 DIAGNOSIS — Z79899 Other long term (current) drug therapy: Secondary | ICD-10-CM | POA: Diagnosis not present

## 2016-05-30 DIAGNOSIS — Z7982 Long term (current) use of aspirin: Secondary | ICD-10-CM | POA: Insufficient documentation

## 2016-05-30 DIAGNOSIS — N939 Abnormal uterine and vaginal bleeding, unspecified: Secondary | ICD-10-CM

## 2016-05-30 DIAGNOSIS — Z87891 Personal history of nicotine dependence: Secondary | ICD-10-CM | POA: Diagnosis not present

## 2016-05-30 DIAGNOSIS — Z5181 Encounter for therapeutic drug level monitoring: Secondary | ICD-10-CM | POA: Diagnosis not present

## 2016-05-30 LAB — BASIC METABOLIC PANEL
ANION GAP: 8 (ref 5–15)
BUN: 16 mg/dL (ref 6–20)
CALCIUM: 8.6 mg/dL — AB (ref 8.9–10.3)
CHLORIDE: 103 mmol/L (ref 101–111)
CO2: 26 mmol/L (ref 22–32)
Creatinine, Ser: 0.9 mg/dL (ref 0.44–1.00)
GFR calc Af Amer: >60 mL/min (ref >60)
GFR calc non Af Amer: >60 mL/min (ref >60)
GLUCOSE: 115 mg/dL — AB (ref 65–99)
Potassium: 3.9 mmol/L (ref 3.5–5.1)
Sodium: 137 mmol/L (ref 135–145)

## 2016-05-30 LAB — APTT: APTT: 27 s (ref 24–36)

## 2016-05-30 LAB — CBC
HCT: 37.7 % (ref 35.0–47.0)
Hemoglobin: 12.3 g/dL (ref 12.0–16.0)
MCH: 27 pg (ref 26.0–34.0)
MCHC: 32.7 g/dL (ref 32.0–36.0)
MCV: 82.7 fL (ref 80.0–100.0)
Platelets: 219 10*3/uL (ref 150–440)
RBC: 4.56 MIL/uL (ref 3.80–5.20)
RDW: 14 % (ref 11.5–14.5)
WBC: 12.7 10*3/uL — ABNORMAL HIGH (ref 3.6–11.0)

## 2016-05-30 LAB — PROTIME-INR
INR: 1.02
Prothrombin Time: 13.6 seconds (ref 11.4–15.0)

## 2016-05-30 LAB — HEMOGLOBIN AND HEMATOCRIT, BLOOD
HEMATOCRIT: 36.5 % (ref 35.0–47.0)
Hemoglobin: 11.8 g/dL — ABNORMAL LOW (ref 12.0–16.0)

## 2016-05-30 LAB — TYPE AND SCREEN
ABO/RH(D): A POS
ANTIBODY SCREEN: NEGATIVE

## 2016-05-30 MED ORDER — MEDROXYPROGESTERONE ACETATE 10 MG PO TABS
10.0000 mg | ORAL_TABLET | ORAL | Status: AC
  Administered 2016-05-30: 10 mg via ORAL
  Filled 2016-05-30: qty 1

## 2016-05-30 MED ORDER — MEDROXYPROGESTERONE ACETATE 10 MG PO TABS: 10.0000 mg | ORAL_TABLET | Freq: Every day | ORAL | Status: DC

## 2016-05-30 NOTE — Telephone Encounter (Signed)
Pt states she is taking Brilinta, and has been bleeding "profusely" (on her cycle) since 2 this morning.

## 2016-05-30 NOTE — Telephone Encounter (Signed)
Spoke w/ pt.  She is on her menstrual period and bleeding "a lot". Asked her to elaborate more, but she repeats "a lot". She is passing clots, had to leave work @ 2 am to change clothes.  She feels that she needs emergent attention and will proceed to the ED. Asked her to have someone drive her, but she feels comfortable driving herself.

## 2016-05-30 NOTE — ED Provider Notes (Signed)
[facility] Emergency Department Provider Note  ____________________________________________  Time seen: Approximately 10:06 AM  I have reviewed the triage vital signs and the nursing notes.   HISTORY  Chief Complaint Vaginal Bleeding and Medication Reaction    HPI Cloria SpringGayle Poteat-Faucette is a 55 y.o. female history of heart disease, high blood pressure.  Patient tells me that about a month ago she started to note some bleeding that was consistent with her menstrual cycle, and lasted a few days. Now today she started noticing again that she was having menstruation, but she reports that it said a rate of about 3 pads per hour over the last day. She is also had occasional clots. She treats this to being on "Brilinta" and also takes aspirin. She is not having a pain nausea or vomiting. She denies any lightheadedness, weakness shortness of breath chest pain or other symptoms.  She reports that she had gone through menopause had not had any menses for over 2 years until last month.  Past Medical History  Diagnosis Date  . Hypertension   . Myocardial infarction [facility](HCC)     STEMI  . Hyperlipidemia LDL goal <70 01/09/2015  . DM2 (diabetes mellitus, type 2) (HCC) 01/09/2015  . CAD, multiple vessel, STEMI-DES-RCA 01/07/15, pLCX DES-01/08/15 01/09/2015    Patient Active Problem List   Diagnosis Date Noted  . Hyperlipidemia LDL goal <70 01/09/2015  . DM2 (diabetes mellitus, type 2) (HCC) 01/09/2015  . CAD, multiple vessel, STEMI-DES-RCA 01/07/15, pLCX DES-01/08/15 01/09/2015  . ST-segment elevation myocardial infarction (STEMI) of inferior wall (HCC) 01/07/2015    Class: Acute  . Essential hypertension 01/07/2015  . Tobacco abuse 01/07/2015    Past Surgical History  Procedure Laterality Date  . Left heart catheterization with coronary angiogram N/A 01/07/2015    Procedure: LEFT HEART CATHETERIZATION WITH CORONARY ANGIOGRAM;  Surgeon: Lesleigh NoeHenry W Smith III, MD;  [facility];  Service: Cardiovascular;  Laterality: N/A;  . Coronary stent placement  01/07/2015    rca  DES, STEMI  . Tubal ligation    . Percutaneous coronary stent intervention (pci-s) N/A 01/08/2015    Procedure: PERCUTANEOUS CORONARY STENT INTERVENTION (PCI-S);  Surgeon: Peter M SwazilandJordan, MD;  [facility];  Service: Cardiovascular;  Laterality: N/A;  . Cardiac catheterization  01/08/2015    stent to LCX, synergy DES  . Coronary angioplasty      stent to LCX, synergy DES    Current Outpatient Rx  Name  Route  Sig  Dispense  Refill  . acetaminophen (TYLENOL) 325 MG tablet   Oral   Take 2 tablets (650 mg total) by mouth every 4 (four) hours as needed for headache or mild pain.         Marland Kitchen. aspirin 81 MG chewable tablet   Oral   Chew 1 tablet (81 mg total) by mouth daily.         Marland Kitchen. atorvastatin (LIPITOR) 40 MG tablet   Oral   Take 40 mg by mouth at bedtime.         . Cyanocobalamin (VITAMIN B-12) 2500 MCG SUBL   Sublingual   Place 2,500 mcg under the tongue daily.          Marland Kitchen. losartan-hydrochlorothiazide (HYZAAR) 50-12.5 MG tablet   Oral   Take 1 tablet by mouth daily.   90 tablet   3   . metoprolol tartrate (LOPRESSOR) 25 MG tablet   Oral   Take 1 tablet (25 mg total) by mouth  2 (two) times daily.   60 tablet   11   . nitroGLYCERIN (NITROSTAT) 0.4 MG SL tablet   Sublingual   Place 1 tablet (0.4 mg total) under the tongue every 5 (five) minutes as needed for chest pain.   25 tablet   4   . ticagrelor (BRILINTA) 60 MG TABS tablet   Oral   Take 1 tablet (60 mg total) by mouth 2 (two) times daily.   60 tablet   11   . medroxyPROGESTERone (PROVERA) 10 MG tablet   Oral   Take 1 tablet (10 mg total) by mouth daily.   10 tablet   0     Allergies Ace inhibitors and Sulfa antibiotics  Family History  Problem Relation Age of Onset  . Family history unknown: Yes    Social History Social History  Substance Use Topics  . Smoking status: Former  Smoker -- 0.50 packs/day for 30 years    Types: Cigarettes    Quit date: 01/07/2015  . Smokeless tobacco: Never Used  . Alcohol Use: No    Review of Systems Constitutional: No fever/chills Eyes: No visual changes. ENT: No sore throat. Cardiovascular: Denies chest pain. Respiratory: Denies shortness of breath. Gastrointestinal: No abdominal pain.  No nausea, no vomiting.  No diarrhea.  No constipation. Genitourinary: Negative for dysuria.Denies rectal bleeding. No black stool. Musculoskeletal: Negative for back pain. Skin: Negative for rash. Neurological: Negative for headaches, focal weakness or numbness.  10-point ROS otherwise negative.  ____________________________________________   PHYSICAL EXAM:  VITAL SIGNS: ED Triage Vitals  Enc Vitals Group     BP --      Pulse --      Resp --      Temp --      Temp src --      SpO2 --      Weight 05/30/16 0937 170 lb (77.111 kg)     Height 05/30/16 0937  (1.575 m)     Head Cir --      Peak Flow --      Pain Score 05/30/16 0937 0     Pain Loc --      Pain Edu? --      Excl. in GC? --    Constitutional: Alert and oriented. Well appearing and in no acute distress. Eyes: Conjunctivae are normal. PERRL. EOMI. Head: Atraumatic. Nose: No congestion/rhinnorhea. Mouth/Throat: Mucous membranes are moist.  Oropharynx non-erythematous. Neck: No stridor.   Cardiovascular: Normal rate, regular rhythm. Grossly normal heart sounds.  Good peripheral circulation. Respiratory: Normal respiratory effort.  No retractions. Lungs CTAB. Gastrointestinal: Soft and nontender. No distention.  Musculoskeletal: No lower extremity tenderness nor edema.  No joint effusions. Neurologic:  Normal speech and language. No gross focal neurologic deficits are appreciated. No gait instability. Skin:  Skin is warm, dry and intact. No rash noted. Psychiatric: Mood and affect are normal. Speech and behavior are  normal.  ____________________________________________   LABS (all labs ordered are listed, but only abnormal results are displayed)  Labs Reviewed  CBC - Abnormal; Notable for the following:    WBC 12.7 (*)    All other components within normal limits  BASIC METABOLIC PANEL - Abnormal; Notable for the following:    Glucose, Bld 115 (*)    Calcium 8.6 (*)    All other components within normal limits  HEMOGLOBIN AND HEMATOCRIT, BLOOD - Abnormal; Notable for the following:    Hemoglobin 11.8 (*)    All other components within  normal limits  PROTIME-INR  APTT  TYPE AND SCREEN   ____________________________________________  EKG   ____________________________________________  RADIOLOGY  US Transvaginal Non-OB (Final result) Result time: 05/30/16 11:58:08   Final result by Rad Results In Interface (05/30/16 11:58:08)   Narrative:   CLINICAL DATA: Vaginal bleeding  EXAM: TRANSABDOMINAL AND TRANSVAGINAL ULTRASOUND OF PELVIS  TECHNIQUE: Both transabdominal and transvaginal ultrasound examinations of the pelvis were performed. Transabdominal technique was performed for global imaging of the pelvis including uterus, ovaries, adnexal regions, and pelvic cul-de-sac. It was necessary to proceed with endovaginal exam following the transabdominal exam to visualize the endometrium.  COMPARISON: None  FINDINGS: Uterus  Measurements: 8.9 x 3.0 x 4.2 cm. No fibroids or other mass visualized.  Endometrium  Thickness: 10 mm. Hypervascularity is noted within the endometrium.  Right ovary  Not visualized  Left ovary  Not visualized  Other findings  No abnormal free fluid.  IMPRESSION: The ovaries are not well visualized.  Prominent endometrium with increased vascularity. No definitive mass is seen.   Electronically Signed By: Alcide Clever M.D. On: 05/30/2016 11:58       ____________________________________________   PROCEDURES  Procedure(s)  performed: None  Critical Care performed: No  ____________________________________________   INITIAL IMPRESSION / ASSESSMENT AND PLAN / ED COURSE  Pertinent labs & imaging results that were available during my care of the patient were reviewed by me and considered in my medical decision making (see chart for details).  Hemoglobin stable. Ultrasound reveals no mass, but thickened endometrium is noted. Discussed case with Dr. Jean Rosenthal of gynecology who advised placing the patient on Provera 10 mg for the next several days. This was selected as she is a smoker, wish to minimize risk for thromboembolic disease.  She is stable, has no signs or symptoms of anemia. Repeat hemoglobin showed stability. The patient given Provera here, careful return precautions.  Return precautions and treatment recommendations and follow-up discussed with the patient who is agreeable with the plan.  ____________________________________________   FINAL CLINICAL IMPRESSION(S) / ED DIAGNOSES  Final diagnoses:  Vagina bleeding      Sharyn Creamer, MD 05/30/16 1317

## 2016-05-30 NOTE — Discharge Instructions (Signed)
Start Provera.  Please follow up closely with obstetrics and gynecology.  Return to the emergency room if your bleeding worsens, you become weak and dizzy or lightheaded, you have an episode of passing out, develop severe bleeding such as more than 1 soaked pad per hour for more than 3 straight hours, develop abdominal or pelvic pain, fevers chills or other new concerns arise.    Dysfunctional Uterine Bleeding Dysfunctional uterine bleeding is abnormal bleeding from the uterus. Dysfunctional uterine bleeding includes:  A period that comes earlier or later than usual.  A period that is lighter, heavier, or has blood clots.  Bleeding between periods.  Skipping one or more periods.  Bleeding after sexual intercourse.  Bleeding after menopause. HOME CARE INSTRUCTIONS  Pay attention to any changes in your symptoms. Follow these instructions to help with your condition: Eating  Eat well-balanced meals. Include foods that are high in iron, such as liver, meat, shellfish, green leafy vegetables, and eggs.  If you become constipated:  Drink plenty of water.  Eat fruits and vegetables that are high in water and fiber, such as spinach, carrots, raspberries, apples, and mango. Medicines  Take over-the-counter and prescription medicines only as told by your health care provider.  Do not change medicines without talking with your health care provider.  Aspirin or medicines that contain aspirin may make the bleeding worse. Do not take those medicines:  During the week before your period.  During your period.  If you were prescribed iron pills, take them as told by your health care provider. Iron pills help to replace iron that your body loses because of this condition. Activity  If you need to change your sanitary pad or tampon more than one time every 2 hours:  Lie in bed with your feet raised (elevated).  Place a cold pack on your lower abdomen.  Rest as much as possible until  the bleeding stops or slows down.  Do not try to lose weight until the bleeding has stopped and your blood iron level is back to normal. Other Instructions  For two months, write down:  When your period starts.  When your period ends.  When any abnormal bleeding occurs.  What problems you notice.  Keep all follow up visits as told by your health care provider. This is important. SEEK MEDICAL CARE IF:  You get light-headed or weak.  You have nausea and vomiting.  You cannot eat or drink without vomiting.  You feel dizzy or have diarrhea while you are taking medicines.  You are taking birth control pills or hormones, and you want to change them or stop taking them. SEEK IMMEDIATE MEDICAL CARE IF:  You develop a fever or chills.  You need to change your sanitary pad or tampon more than one time per hour.  Your bleeding becomes heavier, or your flow contains clots more often.  You develop pain in your abdomen.  You lose consciousness.  You develop a rash.   This information is not intended to replace advice given to you by your health care provider. Make sure you discuss any questions you have with your health care provider.   Document Released: 11/28/2000 Document Revised: 08/22/2015 Document Reviewed: 02/26/2015 Elsevier Interactive Patient Education [employer]2016 [employer] Inc.

## 2016-05-30 NOTE — ED Notes (Signed)
Pt states at 2am started having what she describes as profuse vaginal bleeding and had to leave work. Pt is on Brillinta and has been on it for 18 months for a stent. Sees Dr. Mariah MillingGollan.  Pt going through about 3 pads an hour.  Denies any dizziness or lightheadedness and drove herself here.

## 2016-06-26 ENCOUNTER — Inpatient Hospital Stay: Admission: RE | Admit: 2016-06-26 | Payer: Managed Care, Other (non HMO) | Source: Ambulatory Visit

## 2016-06-27 ENCOUNTER — Encounter
Admission: RE | Admit: 2016-06-27 | Discharge: 2016-06-27 | Disposition: A | Discharge status: Home / Self Care | Payer: Managed Care, Other (non HMO) | Source: Ambulatory Visit | Attending: Obstetrics & Gynecology | Admitting: Obstetrics & Gynecology

## 2016-06-27 DIAGNOSIS — Z01812 Encounter for preprocedural laboratory examination: Secondary | ICD-10-CM | POA: Diagnosis present

## 2016-06-27 LAB — CBC
HEMATOCRIT: 38.1 % (ref 35.0–47.0)
Hemoglobin: 12.6 g/dL (ref 12.0–16.0)
MCH: 28.2 pg (ref 26.0–34.0)
MCHC: 33 g/dL (ref 32.0–36.0)
MCV: 85.5 fL (ref 80.0–100.0)
Platelets: 234 10*3/uL (ref 150–440)
RBC: 4.46 MIL/uL (ref 3.80–5.20)
RDW: 15.6 % — ABNORMAL HIGH (ref 11.5–14.5)
WBC: 10.7 10*3/uL (ref 3.6–11.0)

## 2016-06-27 LAB — BASIC METABOLIC PANEL
Anion gap: 6 (ref 5–15)
BUN: 14 mg/dL (ref 6–20)
CO2: 26 mmol/L (ref 22–32)
Calcium: 9.2 mg/dL (ref 8.9–10.3)
Chloride: 107 mmol/L (ref 101–111)
Creatinine, Ser: 0.88 mg/dL (ref 0.44–1.00)
GFR calc Af Amer: >60 mL/min (ref >60)
GLUCOSE: 102 mg/dL — AB (ref 65–99)
POTASSIUM: 3.5 mmol/L (ref 3.5–5.1)
Sodium: 139 mmol/L (ref 135–145)

## 2016-06-27 LAB — PROTIME-INR
INR: 0.99
Prothrombin Time: 13.3 seconds (ref 11.4–15.0)

## 2016-06-27 LAB — TYPE AND SCREEN
ABO/RH(D): A POS
ANTIBODY SCREEN: NEGATIVE

## 2016-06-27 LAB — APTT: aPTT: 24 seconds (ref 24–36)

## 2016-06-27 NOTE — Pre-Procedure Instructions (Signed)
ANESTHESIA - CARDIO OFFICE NOTE FOR EKG DATED SAME  Jane Poteat-Faucette  10/31/2015 9:20 AM  Office Visit  MRN:  [id number]   Description: Female DOB: [birth year]  Provider: Antonieta Iba, MD  Department: [facility]       Vital Signs  Most recent update: 10/31/2015 9:34 AM by Kendrick Fries, CMA    BP Pulse Ht Wt BMI    110/82 mmHg 65 5' 2.5" (1.588 m) 208 lb 8 oz (94.575 kg) 37.50 kg/m2    Vitals History     Progress Notes      Antonieta Iba, MD at 10/31/2015 1:38 PM     Status: Signed       Expand All Collapse All     Patient ID: Cloria Spring, female DOB: 1961/03/01, 55 y.o. MRN: [id number]  HPI Comments: [name] is a 55 year old woman with history of chronic back pain, smoking, hyperlipidemia, hypertension who presents for routine follow-up of her coronary artery disease She had recent admission to the hospital for STEMI, subtotally occluded RCA in the mid to distal region also with left circumflex disease. On 01/07/2015 she had DES to her RCA. On January 25th 2016 she had DES to her left circumflex. She has residual 30 and 40% proximal and mid LAD disease. Peak troponin of 6.  LV gram showing no wall motion abnormality.  In follow-up today, she reports that she continues to smoke one pack per day No regular exercise program. Weight continues to be a problem Denies having diabetes though hemoglobin A1c is borderline elevated No chest pain on exertion, overall feels well No lower extremity swelling  EKG on today's visit shows normal sinus rhythm with rate 66 beats per minute, T-wave abnormality inferior leads, improved from prior EKG, no T-wave abnormality in V6   Allergies  Allergen Reactions  . Ace Inhibitors Cough  . Sulfa Antibiotics Other (See Comments)    Severe headaches     Outpatient Encounter Prescriptions as of 10/31/2015  Medication Sig  . acetaminophen (TYLENOL) 325 MG tablet Take 2  tablets (650 mg total) by mouth every 4 (four) hours as needed for headache or mild pain.  Marland Kitchen aspirin 81 MG chewable tablet Chew 1 tablet (81 mg total) by mouth daily.  Marland Kitchen atorvastatin (LIPITOR) 80 MG tablet Take 1 tablet (80 mg total) by mouth daily at 6 PM.  . Cyanocobalamin (VITAMIN B-12) 2500 MCG SUBL Place under the tongue daily.  Marland Kitchen losartan-hydrochlorothiazide (HYZAAR) 50-12.5 MG tablet Take 1 tablet by mouth daily.  . metoprolol tartrate (LOPRESSOR) 25 MG tablet Take 1 tablet (25 mg total) by mouth 2 (two) times daily.  . nitroGLYCERIN (NITROSTAT) 0.4 MG SL tablet Place 1 tablet (0.4 mg total) under the tongue every 5 (five) minutes as needed for chest pain.  . ticagrelor (BRILINTA) 90 MG TABS tablet Take 1 tablet (90 mg total) by mouth 2 (two) times daily.  . [DISCONTINUED] atorvastatin (LIPITOR) 80 MG tablet Take 1 tablet (80 mg total) by mouth daily at 6 PM. Appointment needed for future refills  . [DISCONTINUED] losartan-hydrochlorothiazide (HYZAAR) 50-12.5 MG per tablet Take 1 tablet by mouth daily.  . [DISCONTINUED] metoprolol tartrate (LOPRESSOR) 25 MG tablet TAKE 1 TABLET (25 MG TOTAL) BY MOUTH 2 (TWO) TIMES DAILY. APPOINTMENT NEEDED FOR FUTURE REFILLS  . [DISCONTINUED] ticagrelor (BRILINTA) 90 MG TABS tablet Take 1 tablet (90 mg total) by mouth 2 (two) times daily.  . [DISCONTINUED] nicotine (NICODERM CQ - DOSED IN MG/24 HOURS) 14 mg/24hr patch Place 1 patch (  14 mg total) onto the skin daily. (Patient not taking: Reported on 10/31/2015)  . [DISCONTINUED] vitamin B-12 (CYANOCOBALAMIN) 250 MCG tablet Take 250 mcg by mouth daily.   No facility-administered encounter medications on file as of 10/31/2015.    Past Medical History  Diagnosis Date  . Hypertension   . Myocardial infarction [facility])     STEMI  . Hyperlipidemia LDL goal <70 01/09/2015  . DM2 (diabetes mellitus, type 2) (HCC) 01/09/2015  . CAD, multiple vessel,  STEMI-DES-RCA 01/07/15, pLCX DES-01/08/15 01/09/2015    Past Surgical History  Procedure Laterality Date  . Left heart catheterization with coronary angiogram N/A 01/07/2015    Procedure: LEFT HEART CATHETERIZATION WITH CORONARY ANGIOGRAM; Surgeon: Lesleigh Noe, MD; Location: [facility] CATH LAB; Service: Cardiovascular; Laterality: N/A;  . Coronary stent placement  01/07/2015    rca DES, STEMI  . Tubal ligation    . Percutaneous coronary stent intervention (pci-s) N/A 01/08/2015    Procedure: PERCUTANEOUS CORONARY STENT INTERVENTION (PCI-S); Surgeon: Peter M Swaziland, MD; Location: [facility] CATH LAB; Service: Cardiovascular; Laterality: N/A;  . Cardiac catheterization  01/08/2015    stent to LCX, synergy DES  . Coronary angioplasty      stent to LCX, synergy DES    Social History  reports that she has been smoking Cigarettes. She has a 15 pack-year smoking history. She has never used smokeless tobacco. She reports that she does not drink alcohol or use illicit drugs.  Family History Family history is unknown by patient.  Review of Systems  Constitutional: Negative.  Respiratory: Negative.  Cardiovascular: Negative.  Gastrointestinal: Negative.  Musculoskeletal: Negative.  Skin: Negative.  Neurological: Negative.  Hematological: Negative.  Psychiatric/Behavioral: Negative.  All other systems reviewed and are negative.   BP 110/82 mmHg  Pulse 65  Ht 5' 2.5" (1.588 m)  Wt 208 lb 8 oz (94.575 kg)  BMI 37.50 kg/m2  Physical Exam  Constitutional: She is oriented to person, place, and time. She appears well-developed and well-nourished.  HENT:  Head: Normocephalic.  Nose: Nose normal.  Mouth/Throat: Oropharynx is clear and moist.  Eyes: Conjunctivae are normal. Pupils are equal, round, and reactive to light.  Neck: Normal range of motion. Neck supple. No JVD present.  Cardiovascular: Normal rate, regular rhythm, S1  normal, S2 normal, normal heart sounds and intact distal pulses. Exam reveals no gallop and no friction rub.  No murmur heard. Pulmonary/Chest: Effort normal and breath sounds normal. No respiratory distress. She has no wheezes. She has no rales. She exhibits no tenderness.  Abdominal: Soft. Bowel sounds are normal. She exhibits no distension. There is no tenderness.  Musculoskeletal: Normal range of motion. She exhibits no edema or tenderness.  Lymphadenopathy:   She has no cervical adenopathy.  Neurological: She is alert and oriented to person, place, and time. Coordination normal.  Skin: Skin is warm and dry. No rash noted. No erythema.  Psychiatric: She has a normal mood and affect. Her behavior is normal. Judgment and thought content normal.    Assessment and Plan  Nursing note and vitals reviewed.                 ST-segment elevation myocardial infarction (STEMI) of inferior wall - Antonieta Iba, MD at 10/31/2015 1:41 PM     Status: Written Related Problem: ST-segment elevation myocardial infarction (STEMI) of inferior wall   Expand All Collapse All   Recommended that she stay on her current medications. Samples of brilinta provided. This is very expensive for her  CAD, multiple vessel, STEMI-DES-RCA 01/07/15, pLCX DES-01/08/15 - Antonieta Iba, MD at 10/31/2015 1:41 PM     Status: Written Related Problem: CAD, multiple vessel, STEMI-DES-RCA 01/07/15, pLCX DES-01/08/15   Expand All Collapse All   Results discussed with her in detail. No further workup at this time. Recommended a regular exercise program Continue on antiplatelet medication            DM2 (diabetes mellitus, type 2) - Antonieta Iba, MD at 10/31/2015 1:42 PM     Status: Written Related Problem: DM2 (diabetes mellitus, type 2)   Expand All Collapse All   Stressed importance of weight loss We have encouraged continued exercise, careful diet management               Tobacco abuse - Antonieta Iba, MD at 10/31/2015 1:42 PM     Status: Written Related Problem: Tobacco abuse   Expand All Collapse All   We have encouraged her to continue to work on weaning her cigarettes and smoking cessation. She will continue to work on this and does not want any assistance with chantix.             Hyperlipidemia LDL goal <70 - Antonieta Iba, MD at 10/31/2015 1:43 PM     Status: Written Related Problem: Hyperlipidemia LDL goal <70   Expand All Collapse All   She reports that she will have routine blood work with primary care in January 2017 Goal LDL less than 70 If she does not meet goal, could change to Crestor +/- zetia            Essential hypertension - Antonieta Iba, MD at 10/31/2015 1:44 PM     Status: Written Related Problem: Essential hypertension   Expand All Collapse All   Blood pressure is well controlled on today's visit. No changes made to the medications.             Diagnoses     CAD, multiple vessel - Primary    ICD-9-CM: 414.00 ICD-10-CM: I25.10    Essential hypertension     ICD-9-CM: 401.9 ICD-10-CM: I10    ST-segment elevation myocardial infarction (STEMI) of inferior wall (HCC)     ICD-9-CM: 410.40 ICD-10-CM: I21.19    Type 2 diabetes mellitus with other circulatory complication (HCC)     ICD-10-CM: E11.59    Tobacco abuse     ICD-9-CM: 305.1 ICD-10-CM: Z72.0    Hyperlipidemia LDL goal <70     ICD-9-CM: 272.4 ICD-10-CM: E78.5       Reason for Visit     Other    6 month f/u. Meds reviewed verbally with pt.    Reason for Visit History        Ordered Medications       Disp Refills Start End    atorvastatin (LIPITOR) 80 MG tablet (Discontinued) 30 tablet 11 10/31/2015 05/30/2016    Take 1 tablet (80 mg total) by mouth daily at 6 PM. - Oral    Reason for Discontinue: Dose change    losartan-hydrochlorothiazide (HYZAAR) 50-12.5 MG  tablet (Discontinued) 30 tablet 11 10/31/2015 01/22/2016    Take 1 tablet by mouth daily. - Oral    Reason for Discontinue: Reorder    metoprolol tartrate (LOPRESSOR) 25 MG tablet 60 tablet 11 10/31/2015     Take 1 tablet (25 mg total) by mouth 2 (two) times daily. - Oral    ticagrelor (BRILINTA) 90 MG TABS tablet (Discontinued) 60 tablet 11 10/31/2015 01/04/2016  Take 1 tablet (90 mg total) by mouth 2 (two) times daily. - Oral    Reason for Discontinue: Reorder      Discontinued Medications       Reason for Discontinue    nicotine (NICODERM CQ - DOSED IN MG/24 HOURS) 14 mg/24hr patch Error    vitamin B-12 (CYANOCOBALAMIN) 250 MCG tablet Error    atorvastatin (LIPITOR) 80 MG tablet Reorder    losartan-hydrochlorothiazide (HYZAAR) 50-12.5 MG per tablet Reorder    metoprolol tartrate (LOPRESSOR) 25 MG tablet Reorder    ticagrelor (BRILINTA) 90 MG TABS tablet Reorder      Level of Service     PR OFFICE OUTPATIENT VISIT 25 MINUTES [16109]   LOS History      Follow-up and Disposition     Return in about 6 months (around 04/29/2016).       All Charges for This Encounter     Code Description Service Date Service Provider Modifiers Qty    93000 PR ELECTROCARDIOGRAM, COMPLETE 10/31/2015 Antonieta Iba, MD  1    [phone] PR OFFICE OUTPATIENT VISIT 25 MINUTES 10/31/2015 Antonieta Iba, MD  1      AVS Reports     Date/Time Report Action User    10/31/2015 9:58 AM After Visit Summary Printed Marilynne Halsted, RN    10/31/2015 9:56 AM After Visit Summary Printed Antonieta Iba, MD      Patient Instructions     You are doing well. No medication changes were made.  Try to quit smoking!  Please call us if you have new issues that need to be addressed before your next appt.  Your physician wants you to follow-up in: 6 months.  You will receive a reminder letter in the mail two months in advance. If you don't receive a  letter, please call our office to schedule the follow-up appointment.  Steps to Quit Smoking  Smoking tobacco can be harmful to your health and can affect almost every organ in your body. Smoking puts you, and those around you, at risk for developing many serious chronic diseases. Quitting smoking is difficult, but it is one of the best things that you can do for your health. It is never too late to quit. WHAT ARE THE BENEFITS OF QUITTING SMOKING? When you quit smoking, you lower your risk of developing serious diseases and conditions, such as:  Lung cancer or lung disease, such as COPD.  Heart disease.  Stroke.  Heart attack.  Infertility.  Osteoporosis and bone fractures. Additionally, symptoms such as coughing, wheezing, and shortness of breath may get better when you quit. You may also find that you get sick less often because your body is stronger at fighting off colds and infections. If you are pregnant, quitting smoking can help to reduce your chances of having a baby of low birth weight. HOW DO I GET READY TO QUIT? When you decide to quit smoking, create a plan to make sure that you are successful. Before you quit:  Pick a date to quit. Set a date within the next two weeks to give you time to prepare.  Write down the reasons why you are quitting. Keep this list in places where you will see it often, such as on your bathroom mirror or in your car or wallet.  Identify the people, places, things, and activities that make you want to smoke (triggers) and avoid them. Make sure to take these actions:  Throw away all cigarettes at home,  at work, and in your car.  Throw away smoking accessories, such as Set designer.  Clean your car and make sure to empty the ashtray.  Clean your home, including curtains and carpets.  Tell your family, friends, and coworkers that you are quitting. Support from your loved ones can make quitting easier.  Talk with your health care  provider about your options for quitting smoking.  Find out what treatment options are covered by your health insurance. WHAT STRATEGIES CAN I USE TO QUIT SMOKING?  Talk with your healthcare provider about different strategies to quit smoking. Some strategies include:  Quitting smoking altogether instead of gradually lessening how much you smoke over a period of time. Research shows that quitting "cold [place]" is more successful than gradually quitting.  Attending in-person counseling to help you build problem-solving skills. You are more likely to have success in quitting if you attend several counseling sessions. Even short sessions of 10 minutes can be effective.  Finding resources and support systems that can help you to quit smoking and remain smoke-free after you quit. These resources are most helpful when you use them often. They can include:  Online chats with a Veterinary surgeon.  Telephone quitlines.  Printed Materials engineer.  Support groups or group counseling.  Text messaging programs.  Mobile phone applications.  Taking medicines to help you quit smoking. (If you are pregnant or breastfeeding, talk with your health care provider first.) Some medicines contain nicotine and some do not. Both types of medicines help with cravings, but the medicines that include nicotine help to relieve withdrawal symptoms. Your health care provider may recommend:  Nicotine patches, gum, or lozenges.  Nicotine inhalers or sprays.  Non-nicotine medicine that is taken by mouth. Talk with your health care provider about combining strategies, such as taking medicines while you are also receiving in-person counseling. Using these two strategies together makes you more likely to succeed in quitting than if you used either strategy on its own. If you are pregnant or breastfeeding, talk with your health care provider about finding counseling or other support strategies to quit smoking. Do not take  medicine to help you quit smoking unless told to do so by your health care provider. WHAT THINGS CAN I DO TO MAKE IT EASIER TO QUIT? Quitting smoking might feel overwhelming at first, but there is a lot that you can do to make it easier. Take these important actions:  Reach out to your family and friends and ask that they support and encourage you during this time. Call telephone quitlines, reach out to support groups, or work with a counselor for support.  Ask people who smoke to avoid smoking around you.  Avoid places that trigger you to smoke, such as bars, parties, or smoke-break areas at work.  Spend time around people who do not smoke.  Lessen stress in your life, because stress can be a smoking trigger for some people. To lessen stress, try:  Exercising regularly.  Deep-breathing exercises.  Yoga.  Meditating.  Performing a body scan. This involves closing your eyes, scanning your body from head to toe, and noticing which parts of your body are particularly tense. Purposefully relax the muscles in those areas.  Download or purchase mobile phone or tablet apps (applications) that can help you stick to your quit plan by providing reminders, tips, and encouragement. There are many free apps, such as QuitGuide from the [employer] developer for Disease Control and Prevention). You can find other support for quitting  smoking (smoking cessation) through smokefree.gov and other websites. HOW WILL I FEEL WHEN I QUIT SMOKING? Within the first 24 hours of quitting smoking, you may start to feel some withdrawal symptoms. These symptoms are usually most noticeable 2-3 days after quitting, but they usually do not last beyond 2-3 weeks. Changes or symptoms that you might experience include:  Mood swings.  Restlessness, anxiety, or irritation.  Difficulty concentrating.  Dizziness.  Strong cravings for sugary foods in addition to nicotine.  Mild weight  gain.  Constipation.  Nausea.  Coughing or a sore throat.  Changes in how your medicines work in your body.  A depressed mood.  Difficulty sleeping (insomnia). After the first 2-3 weeks of quitting, you may start to notice more positive results, such as:  Improved sense of smell and taste.  Decreased coughing and sore throat.  Slower heart rate.  Lower blood pressure.  Clearer skin.  The ability to breathe more easily.  Fewer sick days. Quitting smoking is very challenging for most people. Do not get discouraged if you are not successful the first time. Some people need to make many attempts to quit before they achieve long-term success. Do your best to stick to your quit plan, and talk with your health care provider if you have any questions or concerns.  This information is not intended to replace advice given to you by your health care provider. Make sure you discuss any questions you have with your health care provider.  Document Released: 11/25/2001 Document Revised: 04/17/2015 Document Reviewed: 04/17/2015 Elsevier Interactive Patient Education 2016 [employer]. Smoking Hazards Smoking cigarettes is extremely bad for your health. Tobacco smoke has over 200 known poisons in it. It contains the poisonous gases nitrogen oxide and carbon monoxide. There are over 60 chemicals in tobacco smoke that cause cancer. Some of the chemicals found in cigarette smoke include:   Cyanide.   Benzene.   Formaldehyde.   Methanol (wood alcohol).   Acetylene (fuel used in welding torches).   Ammonia.  Even smoking lightly shortens your life expectancy by several years. You can greatly reduce the risk of medical problems for you and your family by stopping now. Smoking is the most preventable cause of death and disease in our society. Within days of quitting smoking, your circulation improves, you decrease the risk of having a heart attack, and your lung capacity improves.  There may be some increased phlegm in the first few days after quitting, and it may take months for your lungs to clear up completely. Quitting for 10 years reduces your risk of developing lung cancer to almost that of a nonsmoker.  WHAT ARE THE RISKS OF SMOKING? Cigarette smokers have an increased risk of many serious medical problems, including:  Lung cancer.   Lung disease (such as pneumonia, bronchitis, and emphysema).   Heart attack and chest pain due to the heart not getting enough oxygen (angina).   Heart disease and peripheral blood vessel disease.   Hypertension.   Stroke.   Oral cancer (cancer of the lip, mouth, or voice box).   Bladder cancer.   Pancreatic cancer.   Cervical cancer.   Pregnancy complications, including premature birth.   Stillbirths and smaller newborn babies, birth defects, and genetic damage to sperm.   Early menopause.   Lower estrogen level for women.   Infertility.   Facial wrinkles.   Blindness.   Increased risk of broken bones (fractures).   Senile dementia.   Stomach ulcers and internal bleeding.  Delayed wound healing and increased risk of complications during surgery. Because of secondhand smoke exposure, children of smokers have an increased risk of the following:   Sudden infant death syndrome (SIDS).   Respiratory infections.   Lung cancer.   Heart disease.   Ear infections.  WHY IS SMOKING ADDICTIVE? Nicotine is the chemical agent in tobacco that is capable of causing addiction or dependence. When you smoke and inhale, nicotine is absorbed rapidly into the bloodstream through your lungs. Both inhaled and noninhaled nicotine may be addictive.  WHAT ARE THE BENEFITS OF QUITTING?  There are many health benefits to quitting smoking. Some are:   The likelihood of developing cancer and heart disease decreases. Health improvements are seen almost immediately.   Blood pressure, pulse rate,  and breathing patterns start returning to normal soon after quitting.   People who quit may see an improvement in their overall quality of life.  HOW DO YOU QUIT SMOKING? Smoking is an addiction with both physical and psychological effects, and longtime habits can be hard to change. Your health care provider can recommend:  Programs and community resources, which may include group support, education, or therapy.  Replacement products, such as patches, gum, and nasal sprays. Use these products only as directed. Do not replace cigarette smoking with electronic cigarettes (commonly called e-cigarettes). The safety of e-cigarettes is unknown, and some may contain harmful chemicals. FOR MORE INFORMATION  American Lung Association: www.lung.org  American Cancer Society: www.cancer.org  This information is not intended to replace advice given to you by your health care provider. Make sure you discuss any questions you have with your health care provider.  Document Released: 01/08/2005 Document Revised: 09/21/2013 Document Reviewed: 05/23/2013 Elsevier Interactive Patient Education 2016 [employer]. Secondhand Smoke WHAT IS SECONDHAND SMOKE? Secondhand smoke is smoke that comes from burning tobacco. It could be the smoke from a cigarette, a pipe, or a cigar. Even if you are not the one smoking, secondhand smoke exposes you to the dangers of smoking. This is called involuntary, or passive, smoking. There are two types of secondhand smoke:  Sidestream smoke is the smoke that comes off the lighted end of a cigarette, pipe, or cigar.  This type of smoke has the highest amount of cancer-causing agents (carcinogens).  The particles in sidestream smoke are smaller. They get into your lungs more easily.  Mainstream smoke is the smoke that is exhaled by a person who is smoking.  This type of smoke is also dangerous to your health. HOW CAN SECONDHAND SMOKE AFFECT MY HEALTH? Studies show that  there is no safe level of secondhand smoke. This smoke contains thousands of chemicals. At least 69 of them are known to cause cancer. Secondhand smoke can also cause many other health problems. It has been linked to:  Lung cancer.  Cancer of the voice box (larynx) or throat.  Cancer of the sinuses.  Brain cancer.  Bladder cancer.  Stomach cancer.  Breast cancer.  White blood cell cancers (lymphoma and leukemia).  Brain and liver tumors in children.  Heart disease and stroke in adults.  Pregnancy loss (miscarriage).  Diseases in children, such as:  Asthma.  Lung infections.  Ear infections.  Sudden infant death syndrome (SIDS).  Slow growth. WHERE CAN I BE AT RISK FOR EXPOSURE TO SECONDHAND SMOKE?   For adults, the workplace is the main source of exposure to secondhand smoke.  Your workplace should have a policy separating smoking areas from nonsmoking areas.  Smoking areas  should have a system for ventilating and cleaning the air.  For children, the home may be the most dangerous place for exposure to secondhand smoke.  Children who live in apartment buildings may be at risk from smoke drifting from hallways or other people's homes.  For everyone, many public places are possible sources of exposure to secondhand smoke.  These places include restaurants, shopping centers, and parks. HOW CAN I REDUCE MY RISK FOR EXPOSURE TO SECONDHAND SMOKE? The most important thing you can do is not smoke. Discourage family members from smoking. Other ways to reduce exposure for you and your family include the following:  Keep your home smoke free.  Make sure your child care providers do not smoke.  Warn your child about the dangers of smoking and secondhand smoke.  Do not allow smoking in your car. When someone smokes in a car, all the damaging chemicals from the smoke are confined in a small area.  Avoid public places where smoking is allowed.  This information is  not intended to replace advice given to you by your health care provider. Make sure you discuss any questions you have with your health care provider.  Document Released: 01/08/2005 Document Revised: 12/22/2014 Document Reviewed: 03/17/2014 Elsevier Interactive Patient Education 2016 [employer] Smoking Cessation, Tips for Success If you are ready to quit smoking, congratulations! You have chosen to help yourself be healthier. Cigarettes bring nicotine, tar, carbon monoxide, and other irritants into your body. Your lungs, heart, and blood vessels will be able to work better without these poisons. There are many different ways to quit smoking. Nicotine gum, nicotine patches, a nicotine inhaler, or nicotine nasal spray can help with physical craving. Hypnosis, support groups, and medicines help break the habit of smoking. WHAT THINGS CAN I DO TO MAKE QUITTING EASIER?  Here are some tips to help you quit for good:  Pick a date when you will quit smoking completely. Tell all of your friends and family about your plan to quit on that date.  Do not try to slowly cut down on the number of cigarettes you are smoking. Pick a quit date and quit smoking completely starting on that day.  Throw away all cigarettes.   Clean and remove all ashtrays from your home, work, and car.  On a card, write down your reasons for quitting. Carry the card with you and read it when you get the urge to smoke.  Cleanse your body of nicotine. Drink enough water and fluids to keep your urine clear or pale yellow. Do this after quitting to flush the nicotine from your body.  Learn to predict your moods. Do not let a bad situation be your excuse to have a cigarette. Some situations in your life might tempt you into wanting a cigarette.  Never have "just one" cigarette. It leads to wanting another and another. Remind yourself of your decision to quit.  Change habits associated with smoking. If you smoked while driving or  when feeling stressed, try other activities to replace smoking. Stand up when drinking your coffee. Brush your teeth after eating. Sit in a different chair when you read the paper. Avoid alcohol while trying to quit, and try to drink fewer caffeinated beverages. Alcohol and caffeine may urge you to smoke.  Avoid foods and drinks that can trigger a desire to smoke, such as sugary or spicy foods and alcohol.  Ask people who smoke not to smoke around you.  Have something planned to do right  after eating or having a cup of coffee. For example, plan to take a walk or exercise.  Try a relaxation exercise to calm you down and decrease your stress. Remember, you may be tense and nervous for the first 2 weeks after you quit, but this will pass.  Find new activities to keep your hands busy. Play with a pen, coin, or rubber band. Doodle or draw things on paper.  Brush your teeth right after eating. This will help cut down on the craving for the taste of tobacco after meals. You can also try mouthwash.   Use oral substitutes in place of cigarettes. Try using lemon drops, carrots, cinnamon sticks, or chewing gum. Keep them handy so they are available when you have the urge to smoke.  When you have the urge to smoke, try deep breathing.  Designate your home as a nonsmoking area.  If you are a heavy smoker, ask your health care provider about a prescription for nicotine chewing gum. It can ease your withdrawal from nicotine.  Reward yourself. Set aside the cigarette money you save and buy yourself something nice.  Look for support from others. Join a support group or smoking cessation program. Ask someone at home or at work to help you with your plan to quit smoking.  Always ask yourself, "Do I need this cigarette or is this just a reflex?" Tell yourself, "Today, I choose not to smoke," or "I do not want to smoke." You are reminding yourself of your decision to quit.  Do not replace cigarette smoking  with electronic cigarettes (commonly called e-cigarettes). The safety of e-cigarettes is unknown, and some may contain harmful chemicals.  If you relapse, do not give up! Plan ahead and think about what you will do the next time you get the urge to smoke. HOW WILL I FEEL WHEN I QUIT SMOKING? You may have symptoms of withdrawal because your body is used to nicotine (the addictive substance in cigarettes). You may crave cigarettes, be irritable, feel very hungry, cough often, get headaches, or have difficulty concentrating. The withdrawal symptoms are only temporary. They are strongest when you first quit but will go away within 10-14 days. When withdrawal symptoms occur, stay in control. Think about your reasons for quitting. Remind yourself that these are signs that your body is healing and getting used to being without cigarettes. Remember that withdrawal symptoms are easier to treat than the major diseases that smoking can cause.  Even after the withdrawal is over, expect periodic urges to smoke. However, these cravings are generally short lived and will go away whether you smoke or not. Do not smoke! WHAT RESOURCES ARE AVAILABLE TO HELP ME QUIT SMOKING? Your health care provider can direct you to community resources or hospitals for support, which may include:  Group support.  Education.  Hypnosis.  Therapy.  This information is not intended to replace advice given to you by your health care provider. Make sure you discuss any questions you have with your health care provider.  Document Released: 08/29/2004 Document Revised: 12/22/2014 Document Reviewed: 05/19/2013 Elsevier Interactive Patient Education 2016 [employer].       Patient Instructions History      Routing History     There are no sent or routed communications associated with this encounter.     Previous Visit       Provider Department Encounter #    10/17/2015 2:44 PM Julien Nordmann, MD  [facility] [id number]      Medical  Necessity Transport Form     Medical Necessity

## 2016-06-27 NOTE — Patient Instructions (Signed)
Your procedure is scheduled on: Tuesday 07/01/16 Report to Day Surgery. 2ND FLOOR MEDICAL MALL ENTRANCE To find out your arrival time please call [phone] between 1PM - 3PM on Monday 06/30/16.  Remember: Instructions that are not followed completely may result in serious medical risk, up to and including death, or upon the discretion of your surgeon and anesthesiologist your surgery may need to be rescheduled.    __X__ 1. Do not eat food or drink liquids after midnight. No gum chewing or hard candies.     __X__ 2. No Alcohol for 24 hours before or after surgery.   ____ 3. Bring all medications with you on the day of surgery if instructed.    __X__ 4. Notify your doctor if there is any change in your medical condition     (cold, fever, infections).     Do not wear jewelry, make-up, hairpins, clips or nail polish.  Do not wear lotions, powders, or perfumes.   Do not shave 48 hours prior to surgery. Men may shave face and neck.  Do not bring valuables to the hospital.    [facility] is not responsible for any belongings or valuables.               Contacts, dentures or bridgework may not be worn into surgery.  Leave your suitcase in the car. After surgery it may be brought to your room.  For patients admitted to the hospital, discharge time is determined by your                treatment team.   Patients discharged the day of surgery will not be allowed to drive home.   Please read over the following fact sheets that you were given:   Surgical Site Infection Prevention   __X__ Take these medicines the morning of surgery with A SIP OF WATER:    1. METOPROLOL  2.   3.   4.  5.  6.  ____ Fleet Enema (as directed)   ____ Use CHG Soap as directed  ____ Use inhalers on the day of surgery  ____ Stop metformin 2 days prior to surgery    ____ Take 1/2 of usual insulin dose the night before surgery and none on the morning of surgery.   __X__ Stop Coumadin/Plavix/aspirin AND  BRILINTA AS DIRECTED BY DR Tiburcio PeaHARRIS  ____ Stop Anti-inflammatories on    __X__ Stop supplements until after surgery.  B12  ____ Bring C-Pap to the hospital.

## 2016-07-01 ENCOUNTER — Encounter: Payer: Self-pay | Admitting: *Deleted

## 2016-07-01 ENCOUNTER — Encounter
Admission: RE | Disposition: A | Discharge status: Home / Self Care | Payer: Self-pay | Source: Ambulatory Visit | Attending: Obstetrics & Gynecology

## 2016-07-01 ENCOUNTER — Ambulatory Visit: Payer: Managed Care, Other (non HMO) | Admitting: Anesthesiology

## 2016-07-01 ENCOUNTER — Ambulatory Visit
Admission: RE | Admit: 2016-07-01 | Discharge: 2016-07-01 | Disposition: A | Discharge status: Home / Self Care | Payer: Managed Care, Other (non HMO) | Source: Ambulatory Visit | Attending: Obstetrics & Gynecology | Admitting: Obstetrics & Gynecology

## 2016-07-01 DIAGNOSIS — I2510 Atherosclerotic heart disease of native coronary artery without angina pectoris: Secondary | ICD-10-CM | POA: Insufficient documentation

## 2016-07-01 DIAGNOSIS — N840 Polyp of corpus uteri: Secondary | ICD-10-CM | POA: Insufficient documentation

## 2016-07-01 DIAGNOSIS — N950 Postmenopausal bleeding: Secondary | ICD-10-CM | POA: Insufficient documentation

## 2016-07-01 DIAGNOSIS — I252 Old myocardial infarction: Secondary | ICD-10-CM | POA: Diagnosis not present

## 2016-07-01 DIAGNOSIS — I10 Essential (primary) hypertension: Secondary | ICD-10-CM | POA: Diagnosis not present

## 2016-07-01 DIAGNOSIS — Z87891 Personal history of nicotine dependence: Secondary | ICD-10-CM | POA: Insufficient documentation

## 2016-07-01 HISTORY — PX: HYSTEROSCOPY WITH D & C: SHX1775

## 2016-07-01 SURGERY — DILATATION AND CURETTAGE /HYSTEROSCOPY
Anesthesia: General

## 2016-07-01 MED ORDER — MIDAZOLAM HCL 2 MG/2ML IJ SOLN
INTRAMUSCULAR | Status: DC | PRN
  Administered 2016-07-01: 2 mg via INTRAVENOUS

## 2016-07-01 MED ORDER — SODIUM CHLORIDE 0.9 % IV SOLN
INTRAVENOUS | Status: DC
  Administered 2016-07-01: 14:00:00 via INTRAVENOUS

## 2016-07-01 MED ORDER — EPHEDRINE SULFATE 50 MG/ML IJ SOLN
INTRAMUSCULAR | Status: DC | PRN
  Administered 2016-07-01: 5 mg via INTRAVENOUS
  Administered 2016-07-01: 10 mg via INTRAVENOUS

## 2016-07-01 MED ORDER — ONDANSETRON HCL 4 MG/2ML IJ SOLN
INTRAMUSCULAR | Status: DC | PRN
  Administered 2016-07-01: 4 mg via INTRAVENOUS

## 2016-07-01 MED ORDER — FAMOTIDINE 20 MG PO TABS
20.0000 mg | ORAL_TABLET | Freq: Once | ORAL | Status: AC
  Administered 2016-07-01: 20 mg via ORAL

## 2016-07-01 MED ORDER — FAMOTIDINE 20 MG PO TABS
ORAL_TABLET | ORAL | Status: AC
  Administered 2016-07-01: 20 mg via ORAL
  Filled 2016-07-01: qty 1

## 2016-07-01 MED ORDER — FENTANYL CITRATE (PF) 100 MCG/2ML IJ SOLN
INTRAMUSCULAR | Status: DC | PRN
Start: 2016-07-01 — End: 2016-07-01
  Administered 2016-07-01: 50 ug via INTRAVENOUS

## 2016-07-01 MED ORDER — FENTANYL CITRATE (PF) 100 MCG/2ML IJ SOLN: 25.0000 ug | INTRAMUSCULAR | Status: DC | PRN

## 2016-07-01 MED ORDER — METOPROLOL TARTRATE 25 MG PO TABS
25.0000 mg | ORAL_TABLET | Freq: Once | ORAL | Status: AC
  Administered 2016-07-01: 25 mg via ORAL

## 2016-07-01 MED ORDER — SILVER NITRATE-POT NITRATE 75-25 % EX MISC
CUTANEOUS | Status: AC
  Filled 2016-07-01: qty 1

## 2016-07-01 MED ORDER — OXYCODONE HCL 5 MG/5ML PO SOLN: 5.0000 mg | Freq: Once | ORAL | Status: DC | PRN

## 2016-07-01 MED ORDER — LIDOCAINE HCL (CARDIAC) 20 MG/ML IV SOLN
INTRAVENOUS | Status: DC | PRN
  Administered 2016-07-01: 80 mg via INTRAVENOUS

## 2016-07-01 MED ORDER — OXYCODONE-ACETAMINOPHEN 5-325 MG PO TABS: 1.0000 | ORAL_TABLET | ORAL | Status: DC | PRN

## 2016-07-01 MED ORDER — METOPROLOL TARTRATE 25 MG PO TABS
ORAL_TABLET | ORAL | Status: AC
  Filled 2016-07-01: qty 1

## 2016-07-01 MED ORDER — PROPOFOL 10 MG/ML IV BOLUS
INTRAVENOUS | Status: DC | PRN
  Administered 2016-07-01: 150 mg via INTRAVENOUS

## 2016-07-01 MED ORDER — DEXAMETHASONE SODIUM PHOSPHATE 10 MG/ML IJ SOLN
INTRAMUSCULAR | Status: DC | PRN
  Administered 2016-07-01: 5 mg via INTRAVENOUS

## 2016-07-01 MED ORDER — OXYCODONE HCL 5 MG PO TABS: 5.0000 mg | ORAL_TABLET | Freq: Once | ORAL | Status: DC | PRN

## 2016-07-01 SURGICAL SUPPLY — 21 items
ABLATOR ENDOMETRIAL MYOSURE (ABLATOR) ×2 IMPLANT
BAG COUNTER SPONGE EZ (MISCELLANEOUS) ×2 IMPLANT
CANISTER SUC SOCK COL 7IN (MISCELLANEOUS) ×2 IMPLANT
CATH ROBINSON RED A/P 16FR (CATHETERS) ×2 IMPLANT
DEVICE MYOSURE LITE (MISCELLANEOUS) ×2 IMPLANT
ELECT REM PT RETURN 9FT ADLT (ELECTROSURGICAL) ×2
ELECTRODE REM PT RTRN 9FT ADLT (ELECTROSURGICAL) ×1 IMPLANT
GLOVE BIO SURGEON STRL SZ8 (GLOVE) ×10 IMPLANT
GOWN STRL REUS W/ TWL LRG LVL3 (GOWN DISPOSABLE) ×1 IMPLANT
GOWN STRL REUS W/ TWL XL LVL3 (GOWN DISPOSABLE) ×1 IMPLANT
GOWN STRL REUS W/TWL LRG LVL3 (GOWN DISPOSABLE) ×1
GOWN STRL REUS W/TWL XL LVL3 (GOWN DISPOSABLE) ×1
PACK DNC HYST (MISCELLANEOUS) ×2 IMPLANT
PAD OB MATERNITY 4.3X12.25 (PERSONAL CARE ITEMS) ×2 IMPLANT
PAD PREP 24X41 OB/GYN DISP (PERSONAL CARE ITEMS) ×2 IMPLANT
SOL .9 NS 3000ML IRR  AL (IV SOLUTION) ×1
SOL .9 NS 3000ML IRR UROMATIC (IV SOLUTION) ×1 IMPLANT
STRAP SAFETY BODY (MISCELLANEOUS) ×2 IMPLANT
TOWEL OR 17X26 4PK STRL BLUE (TOWEL DISPOSABLE) ×2 IMPLANT
TUBING CONNECTING 10 (TUBING) ×2 IMPLANT
TUBING HYSTEROSCOPY DOLPHIN (MISCELLANEOUS) ×2 IMPLANT

## 2016-07-01 NOTE — Discharge Instructions (Signed)
Hysteroscopy, Care After Refer to this sheet in the next few weeks. These instructions provide you with information on caring for yourself after your procedure. Your health care provider may also give you more specific instructions. Your treatment has been planned according to current medical practices, but problems sometimes occur. Call your health care provider if you have any problems or questions after your procedure.  WHAT TO EXPECT AFTER THE PROCEDURE After your procedure, it is typical to have the following:  You may have some cramping. This normally lasts for a couple days.  You may have bleeding. This can vary from light spotting for a few days to menstrual-like bleeding for 3-7 days. HOME CARE INSTRUCTIONS  Rest for the first 1-2 days after the procedure.  Only take over-the-counter or prescription medicines as directed by your health care provider. Do not take aspirin. It can increase the chances of bleeding.  Take showers instead of baths for 2 weeks or as directed by your health care provider.  Do not drive for 24 hours or as directed.  Do not drink alcohol while taking pain medicine.  Do not use tampons, douche, or have sexual intercourse for 2 weeks or until your health care provider says it is okay.  Take your temperature twice a day for 4-5 days. Write it down each time.  Follow your health care provider's advice about diet, exercise, and lifting.  If you develop constipation, you may:  Take a mild laxative if your health care provider approves.  Add bran foods to your diet.  Drink enough fluids to keep your urine clear or pale yellow.  Try to have someone with you or available to you for the first 24-48 hours, especially if you were given a general anesthetic.  Follow up with your health care provider as directed. SEEK MEDICAL CARE IF:  You feel dizzy or lightheaded.  You feel sick to your stomach (nauseous).  You have abnormal vaginal discharge.  You  have a rash.  You have pain that is not controlled with medicine. SEEK IMMEDIATE MEDICAL CARE IF:  You have bleeding that is heavier than a normal menstrual period.  You have a fever.  You have increasing cramps or pain, not controlled with medicine.  You have new belly (abdominal) pain.  You pass out.  You have pain in the tops of your shoulders (shoulder strap areas).  You have shortness of breath.   This information is not intended to replace advice given to you by your health care provider. Make sure you discuss any questions you have with your health care provider.   Document Released: 09/21/2013 Document Reviewed: 09/21/2013 Elsevier Interactive Patient Education 2016 [place]   1) The drugs that you were given will stay in your system until tomorrow so for the next 24 hours you should not:  A) Drive an automobile B) Make any legal decisions C) Drink any alcoholic beverage   2) You may resume regular meals tomorrow.  Today it is better to start with liquids and gradually work up to solid foods.  You may eat anything you prefer, but it is better to start with liquids, then soup and crackers, and gradually work up to solid foods.   3) Please notify your doctor immediately if you have any unusual bleeding, trouble breathing, redness and pain at the surgery site, drainage, fever, or pain not relieved by medication.    4) Additional Instructions:  Please contact your physician with any problems or Same Day Surgery at [phone], Monday through Friday 6 am to 4 pm, or [place] at [facility] Main number at [phone].AMBULATORY SURGERY  DISCHARGE INSTRUCTIONS   5) The drugs that you were given will stay in your system until tomorrow so for the next 24 hours you should not:  D) Drive an automobile E) Make any legal decisions F) Drink any alcoholic beverage   6) You may resume regular meals  tomorrow.  Today it is better to start with liquids and gradually work up to solid foods.  You may eat anything you prefer, but it is better to start with liquids, then soup and crackers, and gradually work up to solid foods.   7) Please notify your doctor immediately if you have any unusual bleeding, trouble breathing, redness and pain at the surgery site, drainage, fever, or pain not relieved by medication.    8) Additional Instructions:        Please contact your physician with any problems or Same Day Surgery at [phone], Monday through Friday 6 am to 4 pm, or [place] at [facility] Main number at [phone].AMBULATORY SURGERY  DISCHARGE INSTRUCTIONS   9) The drugs that you were given will stay in your system until tomorrow so for the next 24 hours you should not:  G) Drive an automobile H) Make any legal decisions I) Drink any alcoholic beverage   10) You may resume regular meals tomorrow.  Today it is better to start with liquids and gradually work up to solid foods.  You may eat anything you prefer, but it is better to start with liquids, then soup and crackers, and gradually work up to solid foods.   11) Please notify your doctor immediately if you have any unusual bleeding, trouble breathing, redness and pain at the surgery site, drainage, fever, or pain not relieved by medication.    12) Additional Instructions:        Please contact your physician with any problems or Same Day Surgery at [phone], Monday through Friday 6 am to 4 pm, or [place] at [facility] Main number at [phone].

## 2016-07-01 NOTE — Anesthesia Postprocedure Evaluation (Signed)
Anesthesia Post Note  Patient: Vanessa Snow  Procedure(s) Performed: Procedure(s) (LRB): DILATATION AND CURETTAGE /HYSTEROSCOPY (N/A)  Patient location during evaluation: PACU Anesthesia Type: General Level of consciousness: awake and alert Pain management: pain level controlled Vital Signs Assessment: post-procedure vital signs reviewed and stable Respiratory status: spontaneous breathing, nonlabored ventilation, respiratory function stable and patient connected to nasal cannula oxygen Cardiovascular status: blood pressure returned to baseline and stable Postop Assessment: no signs of nausea or vomiting Anesthetic complications: no    Last Vitals:  Filed Vitals:   07/01/16 1626 07/01/16 1645  BP: 152/73 144/79  Pulse: 55 60  Temp: 36.3 C   Resp: 14     Last Pain: There were no vitals filed for this visit.               Cleda MccreedyJoseph K Jessina Marse

## 2016-07-01 NOTE — Anesthesia Procedure Notes (Signed)
Procedure Name: LMA Insertion Date/Time: 07/01/2016 3:00 PM Performed by: Irving BurtonBACHICH, Aldena Worm Pre-anesthesia Checklist: Patient identified, Emergency Drugs available, Suction available and Patient being monitored Patient Re-evaluated:Patient Re-evaluated prior to inductionOxygen Delivery Method: Circle system utilized Preoxygenation: Pre-oxygenation with 100% oxygen Intubation Type: IV induction Ventilation: Mask ventilation without difficulty LMA: LMA inserted LMA Size: 3.5 Number of attempts: 1 Placement Confirmation: positive ETCO2 and breath sounds checked- equal and bilateral Tube secured with: Tape Dental Injury: Teeth and Oropharynx as per pre-operative assessment

## 2016-07-01 NOTE — Anesthesia Preprocedure Evaluation (Addendum)
Anesthesia Evaluation  Patient identified by MRN, date of birth, ID band Patient awake    Reviewed: Allergy & Precautions, NPO status , Patient's Chart, lab work & pertinent test results, reviewed documented beta blocker date and time   History of Anesthesia Complications Negative for: history of anesthetic complications  Airway Mallampati: II  TM Distance: >3 FB Neck ROM: Full    Dental no notable dental hx.    Pulmonary neg COPD, former smoker,    breath sounds clear to auscultation- rhonchi (-) decreased breath sounds(-) wheezing      Cardiovascular Exercise Tolerance: Good hypertension, Pt. 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 01/07/2015 DES to her RCA. 01/08/15 had DES to her left circumflex. She has residual 30 and 40% proximal and mid LAD disease   Neuro/Psych negative psych ROS   GI/Hepatic negative GI ROS, Neg liver ROS,   Endo/Other  neg diabetes  Renal/GU negative Renal ROS     Musculoskeletal negative musculoskeletal ROS (+)   Abdominal (+) + obese,   Peds  Hematology negative hematology ROS (+)   Anesthesia Other Findings Postmenopausal with new onset vaginal bleeding   Reproductive/Obstetrics                            Anesthesia Physical Anesthesia Plan  ASA: III  Anesthesia Plan: General   Post-op Pain Management:    Induction: Intravenous  Airway Management Planned: LMA  Additional Equipment:   Intra-op Plan:   Post-operative Plan:   Informed Consent: I have reviewed the patients History and Physical, chart, labs and discussed the procedure including the risks, benefits and alternatives for the proposed anesthesia with the patient or authorized  representative who has indicated his/her understanding and acceptance.   Dental advisory given  Plan Discussed with:   Anesthesia Plan Comments:         Anesthesia Quick Evaluation

## 2016-07-01 NOTE — H&P (Signed)
History and Physical Interval Note:  07/01/2016 2:26 PM  Vanessa Snow  has presented today for surgery, with the diagnosis of POST MENOPAUSAL BLEEDING  The various methods of treatment have been discussed with the patient and family. After consideration of risks, benefits and other options for treatment, the patient has consented to  Procedure(s): DILATATION AND CURETTAGE /HYSTEROSCOPY (N/A) as a surgical intervention .  The patient's history has been reviewed, patient examined, no change in status, stable for surgery.  Pt has the following beta blocker history-  Op Day only.  I have reviewed the patient's chart and labs.  Questions were answered to the patient's satisfaction.    [name]

## 2016-07-01 NOTE — Transfer of Care (Signed)
Immediate Anesthesia Transfer of Care Note  Patient: Vanessa Snow  Procedure(s) Performed: Procedure(s): DILATATION AND CURETTAGE /HYSTEROSCOPY (N/A)  Patient Location: PACU  Anesthesia Type:General  Level of Consciousness: awake  Airway & Oxygen Therapy: Patient connected to face mask oxygen  Post-op Assessment: Post -op Vital signs reviewed and stable  Post vital signs: stable  Last Vitals:  Filed Vitals:   07/01/16 1545 07/01/16 1546  BP: 122/68 122/68  Pulse: 67 61  Temp: 36.4 C 36.4 C  Resp: 17 23    Last Pain: There were no vitals filed for this visit.       Complications: No apparent anesthesia complications

## 2016-07-01 NOTE — Op Note (Signed)
Operative Note  07/01/2016  PRE-OP DIAGNOSIS: Postmenopausal Bleeding, Endometrial Polyps  POST-OP DIAGNOSIS: same   SURGEON: Annamarie MajorPaul Katricia Prehn, MD, FACOG  PROCEDURE: Procedure(s): DILATATION AND CURETTAGE /HYSTEROSCOPY   ANESTHESIA: Choice   ESTIMATED BLOOD LOSS: Min   SPECIMENS:  ECC, EMC  FLUID DEFICIT: Min  COMPLICATIONS: None  DISPOSITION: PACU - hemodynamically stable.  CONDITION: stable  FINDINGS: Exam under anesthesia revealed small, mobile  uterus with no masses and bilateral adnexa without masses or fullness. Hysteroscopy revealed a polyp filled uterine cavity with bilateral tubal ostia and normal appearing endocervical canal.  PROCEDURE IN DETAIL: After informed consent was obtained, the patient was taken to the operating room where anesthesia was obtained without difficulty. The patient was positioned in the dorsal lithotomy position in [place] stirrups. The patient's bladder was catheterized with an in and out foley catheter. The patient was examined under anesthesia, with the above noted findings. The weightedspeculum was placed inside the patient's vagina, and the the anterior lip of the cervix was seen and grasped with the tenaculum.  An Endocervical specimen was obtained with a kevorkian curette. The uterine cavity was sounded to 7cm, and then the cervix was progressively dilated to a 17French-Pratt dilator. The 30 degree hysteroscope was introduced, with saline fluid used to distend the intrauterine cavity, with the above noted findings.  The hystersocope was removed and the uterine cavity was curetted until a gritty texture was noted, yielding endometrial curettings. Excellent hemostasis was noted, and all instruments were removed, with excellent hemostasis noted throughout. She was then taken out of dorsal lithotomy. Minimal discrepancy in fluid was noted.  The patient tolerated the procedure well. Sponge, lap and needle counts were correct x2. The patient was taken to  recovery room in excellent condition.

## 2016-07-04 LAB — SURGICAL PATHOLOGY

## 2016-09-19 ENCOUNTER — Ambulatory Visit (INDEPENDENT_AMBULATORY_CARE_PROVIDER_SITE_OTHER): Payer: Managed Care, Other (non HMO) | Admitting: Cardiovascular Disease

## 2016-09-19 ENCOUNTER — Encounter: Payer: Self-pay | Admitting: Cardiovascular Disease

## 2016-09-19 VITALS — BP 130/80 | HR 76 | Ht 62.5 in | Wt 206.5 lb

## 2016-09-19 DIAGNOSIS — E785 Hyperlipidemia, unspecified: Secondary | ICD-10-CM

## 2016-09-19 DIAGNOSIS — E1159 Type 2 diabetes mellitus with other circulatory complications: Secondary | ICD-10-CM | POA: Diagnosis not present

## 2016-09-19 DIAGNOSIS — Z23 Encounter for immunization: Secondary | ICD-10-CM | POA: Diagnosis not present

## 2016-09-19 DIAGNOSIS — Z720 Tobacco use: Secondary | ICD-10-CM

## 2016-09-19 DIAGNOSIS — I2510 Atherosclerotic heart disease of native coronary artery without angina pectoris: Secondary | ICD-10-CM

## 2016-09-19 DIAGNOSIS — I10 Essential (primary) hypertension: Secondary | ICD-10-CM | POA: Diagnosis not present

## 2016-09-19 MED ORDER — CLOPIDOGREL BISULFATE 75 MG PO TABS: 75.0000 mg | ORAL_TABLET | Freq: Every day | ORAL | 3 refills | Status: DC

## 2016-09-19 MED ORDER — VARENICLINE TARTRATE 1 MG PO TABS: 1.0000 mg | ORAL_TABLET | Freq: Two times a day (BID) | ORAL | 3 refills | Status: DC

## 2016-09-19 NOTE — Patient Instructions (Addendum)
Medication Instructions:   Please start chantix 1/2 slowly up to twice as day Then slowly up to full pill twice a day  When you run out of brilinta, Change to plavix one a day With aspirin  Labwork:  Labs today: liver,lipds, HBA1C, bmp  Testing/Procedures:  No further testing at this time   Follow-Up: It was a pleasure seeing you in the office today. Please call us if you have new issues that need to be addressed before your next appt.  [phone]  Your physician wants you to follow-up in: 12 months.  You will receive a reminder letter in the mail two months in advance. If you don't receive a letter, please call our office to schedule the follow-up appointment.  If you need a refill on your cardiac medications before your next appointment, please call your pharmacy.     Steps to Quit Smoking  Smoking tobacco can be harmful to your health and can affect almost every organ in your body. Smoking puts you, and those around you, at risk for developing many serious chronic diseases. Quitting smoking is difficult, but it is one of the best things that you can do for your health. It is never too late to quit. WHAT ARE THE BENEFITS OF QUITTING SMOKING? When you quit smoking, you lower your risk of developing serious diseases and conditions, such as:  Lung cancer or lung disease, such as COPD.  Heart disease.  Stroke.  Heart attack.  Infertility.  Osteoporosis and bone fractures. Additionally, symptoms such as coughing, wheezing, and shortness of breath may get better when you quit. You may also find that you get sick less often because your body is stronger at fighting off colds and infections. If you are pregnant, quitting smoking can help to reduce your chances of having a baby of low birth weight. HOW DO I GET READY TO QUIT? When you decide to quit smoking, create a plan to make sure that you are successful. Before you quit:  Pick a date to quit. Set a date within the  next two weeks to give you time to prepare.  Write down the reasons why you are quitting. Keep this list in places where you will see it often, such as on your bathroom mirror or in your car or wallet.  Identify the people, places, things, and activities that make you want to smoke (triggers) and avoid them. Make sure to take these actions:  Throw away all cigarettes at home, at work, and in your car.  Throw away smoking accessories, such as Set designerashtrays and lighters.  Clean your car and make sure to empty the ashtray.  Clean your home, including curtains and carpets.  Tell your family, friends, and coworkers that you are quitting. Support from your loved ones can make quitting easier.  Talk with your health care provider about your options for quitting smoking.  Find out what treatment options are covered by your health insurance. WHAT STRATEGIES CAN I USE TO QUIT SMOKING?  Talk with your healthcare provider about different strategies to quit smoking. Some strategies include:  Quitting smoking altogether instead of gradually lessening how much you smoke over a period of time. Research shows that quitting "cold [place]" is more successful than gradually quitting.  Attending in-person counseling to help you build problem-solving skills. You are more likely to have success in quitting if you attend several counseling sessions. Even short sessions of 10 minutes can be effective.  Finding resources and support systems that can help  you to quit smoking and remain smoke-free after you quit. These resources are most helpful when you use them often. They can include:  Online chats with a Veterinary surgeon.  Telephone quitlines.  Printed Materials engineer.  Support groups or group counseling.  Text messaging programs.  Mobile phone applications.  Taking medicines to help you quit smoking. (If you are pregnant or breastfeeding, talk with your health care provider first.) Some medicines contain  nicotine and some do not. Both types of medicines help with cravings, but the medicines that include nicotine help to relieve withdrawal symptoms. Your health care provider may recommend:  Nicotine patches, gum, or lozenges.  Nicotine inhalers or sprays.  Non-nicotine medicine that is taken by mouth. Talk with your health care provider about combining strategies, such as taking medicines while you are also receiving in-person counseling. Using these two strategies together makes you more likely to succeed in quitting than if you used either strategy on its own. If you are pregnant or breastfeeding, talk with your health care provider about finding counseling or other support strategies to quit smoking. Do not take medicine to help you quit smoking unless told to do so by your health care provider. WHAT THINGS CAN I DO TO MAKE IT EASIER TO QUIT? Quitting smoking might feel overwhelming at first, but there is a lot that you can do to make it easier. Take these important actions:  Reach out to your family and friends and ask that they support and encourage you during this time. Call telephone quitlines, reach out to support groups, or work with a counselor for support.  Ask people who smoke to avoid smoking around you.  Avoid places that trigger you to smoke, such as bars, parties, or smoke-break areas at work.  Spend time around people who do not smoke.  Lessen stress in your life, because stress can be a smoking trigger for some people. To lessen stress, try:  Exercising regularly.  Deep-breathing exercises.  Yoga.  Meditating.  Performing a body scan. This involves closing your eyes, scanning your body from head to toe, and noticing which parts of your body are particularly tense. Purposefully relax the muscles in those areas.  Download or purchase mobile phone or tablet apps (applications) that can help you stick to your quit plan by providing reminders, tips, and encouragement.  There are many free apps, such as QuitGuide from the [employer] developer for Disease Control and Prevention). You can find other support for quitting smoking (smoking cessation) through smokefree.gov and other websites. HOW WILL I FEEL WHEN I QUIT SMOKING? Within the first 24 hours of quitting smoking, you may start to feel some withdrawal symptoms. These symptoms are usually most noticeable 2-3 days after quitting, but they usually do not last beyond 2-3 weeks. Changes or symptoms that you might experience include:  Mood swings.  Restlessness, anxiety, or irritation.  Difficulty concentrating.  Dizziness.  Strong cravings for sugary foods in addition to nicotine.  Mild weight gain.  Constipation.  Nausea.  Coughing or a sore throat.  Changes in how your medicines work in your body.  A depressed mood.  Difficulty sleeping (insomnia). After the first 2-3 weeks of quitting, you may start to notice more positive results, such as:  Improved sense of smell and taste.  Decreased coughing and sore throat.  Slower heart rate.  Lower blood pressure.  Clearer skin.  The ability to breathe more easily.  Fewer sick days. Quitting smoking is very challenging for most people. Do  not get discouraged if you are not successful the first time. Some people need to make many attempts to quit before they achieve long-term success. Do your best to stick to your quit plan, and talk with your health care provider if you have any questions or concerns.   This information is not intended to replace advice given to you by your health care provider. Make sure you discuss any questions you have with your health care provider.   Document Released: 11/25/2001 Document Revised: 04/17/2015 Document Reviewed: 04/17/2015 Elsevier Interactive Patient Education 2016 [employer]. Smoking Hazards Smoking cigarettes is extremely bad for your health. Tobacco smoke has over 200 known poisons in it. It contains the  poisonous gases nitrogen oxide and carbon monoxide. There are over 60 chemicals in tobacco smoke that cause cancer. Some of the chemicals found in cigarette smoke include:   Cyanide.   Benzene.   Formaldehyde.   Methanol (wood alcohol).   Acetylene (fuel used in welding torches).   Ammonia.  Even smoking lightly shortens your life expectancy by several years. You can greatly reduce the risk of medical problems for you and your family by stopping now. Smoking is the most preventable cause of death and disease in our society. Within days of quitting smoking, your circulation improves, you decrease the risk of having a heart attack, and your lung capacity improves. There may be some increased phlegm in the first few days after quitting, and it may take months for your lungs to clear up completely. Quitting for 10 years reduces your risk of developing lung cancer to almost that of a nonsmoker.  WHAT ARE THE RISKS OF SMOKING? Cigarette smokers have an increased risk of many serious medical problems, including:  Lung cancer.   Lung disease (such as pneumonia, bronchitis, and emphysema).   Heart attack and chest pain due to the heart not getting enough oxygen (angina).   Heart disease and peripheral blood vessel disease.   Hypertension.   Stroke.   Oral cancer (cancer of the lip, mouth, or voice box).   Bladder cancer.   Pancreatic cancer.   Cervical cancer.   Pregnancy complications, including premature birth.   Stillbirths and smaller newborn babies, birth defects, and genetic damage to sperm.   Early menopause.   Lower estrogen level for women.   Infertility.   Facial wrinkles.   Blindness.   Increased risk of broken bones (fractures).   Senile dementia.   Stomach ulcers and internal bleeding.   Delayed wound healing and increased risk of complications during surgery. Because of secondhand smoke exposure, children of smokers have an  increased risk of the following:   Sudden infant death syndrome (SIDS).   Respiratory infections.   Lung cancer.   Heart disease.   Ear infections.  WHY IS SMOKING ADDICTIVE? Nicotine is the chemical agent in tobacco that is capable of causing addiction or dependence. When you smoke and inhale, nicotine is absorbed rapidly into the bloodstream through your lungs. Both inhaled and noninhaled nicotine may be addictive.  WHAT ARE THE BENEFITS OF QUITTING?  There are many health benefits to quitting smoking. Some are:   The likelihood of developing cancer and heart disease decreases. Health improvements are seen almost immediately.   Blood pressure, pulse rate, and breathing patterns start returning to normal soon after quitting.   People who quit may see an improvement in their overall quality of life.  HOW DO YOU QUIT SMOKING? Smoking is an addiction with both physical and psychological  effects, and longtime habits can be hard to change. Your health care provider can recommend:  Programs and community resources, which may include group support, education, or therapy.  Replacement products, such as patches, gum, and nasal sprays. Use these products only as directed. Do not replace cigarette smoking with electronic cigarettes (commonly called e-cigarettes). The safety of e-cigarettes is unknown, and some may contain harmful chemicals. FOR MORE INFORMATION  American Lung Association: www.lung.org  American Cancer Society: www.cancer.org   This information is not intended to replace advice given to you by your health care provider. Make sure you discuss any questions you have with your health care provider.   Document Released: 01/08/2005 Document Revised: 09/21/2013 Document Reviewed: 05/23/2013 Elsevier Interactive Patient Education 2016 [employer]. Secondhand Smoke WHAT IS SECONDHAND SMOKE? Secondhand smoke is smoke that comes from burning tobacco. It could be the smoke  from a cigarette, a pipe, or a cigar. Even if you are not the one smoking, secondhand smoke exposes you to the dangers of smoking. This is called involuntary, or passive, smoking. There are two types of secondhand smoke:  Sidestream smoke is the smoke that comes off the lighted end of a cigarette, pipe, or cigar.  This type of smoke has the highest amount of cancer-causing agents (carcinogens).  The particles in sidestream smoke are smaller. They get into your lungs more easily.  Mainstream smoke is the smoke that is exhaled by a person who is smoking.  This type of smoke is also dangerous to your health. HOW CAN SECONDHAND SMOKE AFFECT MY HEALTH? Studies show that there is no safe level of secondhand smoke. This smoke contains thousands of chemicals. At least 69 of them are known to cause cancer. Secondhand smoke can also cause many other health problems. It has been linked to:  Lung cancer.  Cancer of the voice box (larynx) or throat.  Cancer of the sinuses.  Brain cancer.  Bladder cancer.  Stomach cancer.  Breast cancer.  White blood cell cancers (lymphoma and leukemia).  Brain and liver tumors in children.  Heart disease and stroke in adults.  Pregnancy loss (miscarriage).  Diseases in children, such as:  Asthma.  Lung infections.  Ear infections.  Sudden infant death syndrome (SIDS).  Slow growth. WHERE CAN I BE AT RISK FOR EXPOSURE TO SECONDHAND SMOKE?   For adults, the workplace is the main source of exposure to secondhand smoke.  Your workplace should have a policy separating smoking areas from nonsmoking areas.  Smoking areas should have a system for ventilating and cleaning the air.  For children, the home may be the most dangerous place for exposure to secondhand smoke.  Children who live in apartment buildings may be at risk from smoke drifting from hallways or other people's homes.  For everyone, many public places are possible sources of  exposure to secondhand smoke.  These places include restaurants, shopping centers, and parks. HOW CAN I REDUCE MY RISK FOR EXPOSURE TO SECONDHAND SMOKE? The most important thing you can do is not smoke. Discourage family members from smoking. Other ways to reduce exposure for you and your family include the following:  Keep your home smoke free.  Make sure your child care providers do not smoke.  Warn your child about the dangers of smoking and secondhand smoke.  Do not allow smoking in your car. When someone smokes in a car, all the damaging chemicals from the smoke are confined in a small area.  Avoid public places where smoking is  allowed.   This information is not intended to replace advice given to you by your health care provider. Make sure you discuss any questions you have with your health care provider.   Document Released: 01/08/2005 Document Revised: 12/22/2014 Document Reviewed: 03/17/2014 Elsevier Interactive Patient Education [employer].

## 2016-09-19 NOTE — Progress Notes (Signed)
Cardiology Office Note  Date:  09/19/2016   ID:  [name], DOB [birth year], MRN [id number]  PCP:  Dortha KernBLISS, LAURA K, MD   Chief Complaint  Patient presents with  . other    6 month follow up. Meds reviewed by the pt. verbally. "doing well."     HPI:  [name] is a 55 year old woman with history of Coronary artery disease, STEMI, stent 2 to her RCA and left circumflex, chronic back pain, smoking, hyperlipidemia, hypertension who presents for routine follow-up of her coronary artery disease   admission to the hospital for STEMI January 2016, subtotally occluded RCA in the mid to distal region also with left circumflex disease. she had DES to her RCA.  DES to her left circumflex. She has residual 30 and 40% proximal and mid LAD disease. Peak troponin of 6.  LV gram showing no wall motion abnormality.  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  Weight trending upwards, up 20 pounds   has not seen primary care in some time, no lab work through primary care since 2015  hemoglobin A1c previously 6   EKG on today's visit shows normal sinus rhythm with rate 76 beats per minute, no significant ST or T-wave changes noted, nonspecific ST abnormality   no new changes compared to prior EKG   PMH:   has a past medical history of CAD, multiple vessel, STEMI-DES-RCA 01/07/15, pLCX DES-01/08/15 (01/09/2015); Hyperlipidemia LDL goal <70 (01/09/2015); Hypertension; and Myocardial infarction.  PSH:    Past Surgical History:  Procedure Laterality Date  . CARDIAC CATHETERIZATION  01/08/2015   stent to LCX, synergy DES  . CORONARY ANGIOPLASTY     stent to LCX, synergy  DES  . CORONARY STENT PLACEMENT  01/07/2015   rca  DES, STEMI  . HYSTEROSCOPY W/D&C N/A 07/01/2016   Procedure: DILATATION AND CURETTAGE /HYSTEROSCOPY;  Surgeon: Nadara Mustardobert P Harris, MD;  Location: [facility] ORS;  Service: Gynecology;  Laterality: N/A;  . LEFT HEART CATHETERIZATION WITH CORONARY ANGIOGRAM N/A 01/07/2015   Procedure: LEFT HEART CATHETERIZATION WITH CORONARY ANGIOGRAM;  Surgeon: Lesleigh NoeHenry W Smith III, MD;  [facility];  Service: Cardiovascular;  Laterality: N/A;  . PERCUTANEOUS CORONARY STENT INTERVENTION (PCI-S) N/A 01/08/2015   Procedure: PERCUTANEOUS CORONARY STENT INTERVENTION (PCI-S);  Surgeon: Peter M SwazilandJordan, MD;  [facility];  Service: Cardiovascular;  Laterality: N/A;  . TUBAL LIGATION      Current Outpatient Prescriptions  Medication Sig Dispense Refill  . acetaminophen (TYLENOL) 325 MG tablet Take 2 tablets (650 mg total) by mouth every 4 (four) hours as needed for headache or mild pain.    Marland Kitchen. aspirin 81 MG chewable tablet Chew 1 tablet (81 mg total) by mouth daily.    Marland Kitchen. atorvastatin (LIPITOR) 40 MG tablet Take 40 mg by mouth at bedtime.    . Cyanocobalamin (VITAMIN B-12) 2500 MCG SUBL Place 2,500 mcg under the tongue daily.     . ferrous sulfate 325 (65 FE) MG tablet Take 325 mg by mouth daily with breakfast.    . losartan-hydrochlorothiazide (HYZAAR) 50-12.5 MG tablet Take 1 tablet by mouth daily. 90 tablet 3  .  metoprolol tartrate (LOPRESSOR) 25 MG tablet Take 1 tablet (25 mg total) by mouth 2 (two) times daily. 60 tablet 11  . Multiple Vitamin (MULTIVITAMIN WITH MINERALS) TABS tablet Take 1 tablet by mouth daily.    . nitroGLYCERIN (NITROSTAT) 0.4 MG SL tablet Place 1 tablet (0.4 mg total) under the tongue every 5 (five) minutes as needed for chest pain. 25 tablet 4  . oxyCODONE-acetaminophen (PERCOCET) 5-325 MG tablet Take 1 tablet by mouth every 4 (four) hours as needed for moderate pain or severe pain. 30 tablet 0  . clopidogrel (PLAVIX) 75 MG tablet Take 1  tablet (75 mg total) by mouth daily. 90 tablet 3  . varenicline (CHANTIX CONTINUING MONTH PAK) 1 MG tablet Take 1 tablet (1 mg total) by mouth 2 (two) times daily. 60 tablet 3   No current facility-administered medications for this visit.      Allergies:   Ace inhibitors; Sulfa antibiotics; and Shellfish allergy   Social History:  The patient  reports that she quit smoking about 20 months ago. Her smoking use included Cigarettes. She has a 15.00 pack-year smoking history. She has never used smokeless tobacco. She reports that she does not drink alcohol or use drugs.   Family History:   Family history is unknown by patient.    Review of Systems: Review of Systems  Constitutional: Negative.        Weight gain  Respiratory: Negative.   Cardiovascular: Negative.   Gastrointestinal: Negative.   Musculoskeletal: Negative.   Neurological: Negative.   Psychiatric/Behavioral: Negative.   All other systems reviewed and are negative.    PHYSICAL EXAM: VS:  BP 130/80 (BP Location: Left Arm, Patient Position: Sitting, Cuff Size: Normal)   Pulse 76   Ht 5' 2.5" (1.588 m)   Wt 206 lb 8 oz (93.7 kg)   BMI 37.17 kg/m  , BMI Body mass index is 37.17 kg/m. GEN: Well nourished, well developed, in no acute distress, obese  HEENT: normal  Neck: no JVD, carotid bruits, or masses Cardiac: RRR; no murmurs, rubs, or gallops,no edema  Respiratory:  clear to auscultation bilaterally, normal work of breathing GI: soft, nontender, nondistended, + BS MS: no deformity or atrophy  Skin: warm and dry, no rash Neuro:  Strength and sensation are intact Psych: euthymic mood, full affect    Recent Labs: 06/27/2016: BUN 14; Creatinine, Ser 0.88; Hemoglobin 12.6; Platelets 234; Potassium 3.5; Sodium 139    Lipid Panel Lab Results  Component Value Date   CHOL 221 (H) 01/07/2015   HDL 52 01/07/2015   LDLCALC 156 (H) 01/07/2015   TRIG 65 01/07/2015      Wt Readings from Last 3 Encounters:   09/19/16 206 lb 8 oz (93.7 kg)  07/01/16 180 lb (81.6 kg)  06/27/16 180 lb (81.6 kg)       ASSESSMENT AND PLAN:  CAD, multiple vessel, STEMI-DES-RCA 01/07/15, pLCX DES-01/08/15 - Plan: EKG 12-Lead, Hepatic function panel, Lipid Profile, HgB A1c, Basic Metabolic Panel (BMET) Routine lab work drawn today in the office She is pain significant money for her brilinta We'll change her to Plavix with aspirin  Essential hypertension - Plan: Hepatic function panel, Lipid Profile, HgB A1c, Basic Metabolic Panel (BMET) Blood pressure stable, potassium dropping possibly from HCTZ Discussed with her, will recheck BMP  Hyperlipidemia LDL goal <70 - Plan: Hepatic function panel, Lipid Profile, HgB A1c, Basic Metabolic Panel (BMET) No recent lab work available, lipid panel drawn today  Type 2 diabetes mellitus with other  circulatory complication, without long-term current use of insulin (HCC) - Plan: Hepatic function panel, Lipid Profile, HgB A1c, Basic Metabolic Panel (BMET) Stressed importance of aggressive weight loss, diabetes control She does eat lots of fruit. Dietary guide provided  Tobacco abuse - Plan: Hepatic function panel, Lipid Profile, HgB A1c, Basic Metabolic Panel (BMET) Long discussion concerning smoking cessation. She would like to try Chantix. Discussed how to take the medication  Flu shot provided today   Total encounter time more than 25 minutes  Greater than 50% was spent in counseling and coordination of care with the patient  Disposition:   F/U  12 months   Orders Placed This Encounter  Procedures  . Hepatic function panel  . Lipid Profile  . HgB A1c  . Basic Metabolic Panel (BMET)  . EKG 12-Lead     Signed, Dossie Arbour, M.D., Ph.D. 09/19/2016  [facility] [place], [place] [phone]

## 2016-09-20 LAB — BASIC METABOLIC PANEL
BUN/Creatinine Ratio: 17 (ref 9–23)
BUN: 14 mg/dL (ref 6–24)
CALCIUM: 8.9 mg/dL (ref 8.7–10.2)
CO2: 20 mmol/L (ref 18–29)
CREATININE: 0.81 mg/dL (ref 0.57–1.00)
Chloride: 103 mmol/L (ref 96–106)
GFR calc Af Amer: 95 mL/min/{1.73_m2} (ref >59)
GFR calc non Af Amer: 82 mL/min/{1.73_m2} (ref >59)
GLUCOSE: 95 mg/dL (ref 65–99)
POTASSIUM: 4.5 mmol/L (ref 3.5–5.2)
SODIUM: 142 mmol/L (ref 134–144)

## 2016-09-20 LAB — LIPID PANEL
CHOL/HDL RATIO: 2.5 ratio (ref 0.0–4.4)
Cholesterol, Total: 148 mg/dL (ref 100–199)
HDL: 59 mg/dL (ref >39)
LDL Calculated: 75 mg/dL (ref 0–99)
TRIGLYCERIDES: 71 mg/dL (ref 0–149)
VLDL Cholesterol Cal: 14 mg/dL (ref 5–40)

## 2016-09-20 LAB — HEPATIC FUNCTION PANEL
ALBUMIN: 4 g/dL (ref 3.5–5.5)
ALK PHOS: 128 IU/L — AB (ref 39–117)
ALT: 11 IU/L (ref 0–32)
AST: 14 IU/L (ref 0–40)
Bilirubin Total: 0.4 mg/dL (ref 0.0–1.2)
Bilirubin, Direct: 0.16 mg/dL (ref 0.00–0.40)
Total Protein: 6.9 g/dL (ref 6.0–8.5)

## 2016-09-20 LAB — HEMOGLOBIN A1C
Est. average glucose Bld gHb Est-mCnc: 128 mg/dL
HEMOGLOBIN A1C: 6.1 % — AB (ref 4.8–5.6)

## 2016-11-07 ENCOUNTER — Other Ambulatory Visit: Payer: Self-pay | Admitting: Cardiovascular Disease

## 2016-11-11 ENCOUNTER — Telehealth: Payer: Self-pay | Admitting: Cardiovascular Disease

## 2016-11-11 NOTE — Telephone Encounter (Signed)
Spoke w/ pt.  Advised her to take Tylenol for pulled muscle.  Advised her to contact her PCP for anything stronger. She is appreciative and will call back w/ any further questions or concerns.

## 2016-11-11 NOTE — Telephone Encounter (Signed)
Patient pulled a muscle in her shoulder and wants to know what pain relief she can take? Thank you.

## 2016-11-13 ENCOUNTER — Other Ambulatory Visit: Payer: Self-pay | Admitting: Cardiovascular Disease

## 2017-01-05 ENCOUNTER — Other Ambulatory Visit: Payer: Self-pay | Admitting: Cardiovascular Disease

## 2017-01-05 MED ORDER — METOPROLOL TARTRATE 25 MG PO TABS: 25.0000 mg | ORAL_TABLET | Freq: Two times a day (BID) | ORAL | 3 refills | Status: DC

## 2017-01-27 ENCOUNTER — Other Ambulatory Visit: Payer: Self-pay | Admitting: Cardiovascular Disease

## 2017-02-22 ENCOUNTER — Encounter: Payer: Self-pay | Admitting: Emergency Medicine

## 2017-02-22 ENCOUNTER — Emergency Department
Admission: EM | Admit: 2017-02-22 | Discharge: 2017-02-22 | Disposition: A | Discharge status: Home / Self Care | Payer: Managed Care, Other (non HMO) | Attending: Emergency Medicine | Admitting: Emergency Medicine

## 2017-02-22 DIAGNOSIS — I2510 Atherosclerotic heart disease of native coronary artery without angina pectoris: Secondary | ICD-10-CM | POA: Diagnosis not present

## 2017-02-22 DIAGNOSIS — I10 Essential (primary) hypertension: Secondary | ICD-10-CM | POA: Diagnosis not present

## 2017-02-22 DIAGNOSIS — Z87891 Personal history of nicotine dependence: Secondary | ICD-10-CM | POA: Diagnosis not present

## 2017-02-22 DIAGNOSIS — Z7982 Long term (current) use of aspirin: Secondary | ICD-10-CM | POA: Insufficient documentation

## 2017-02-22 DIAGNOSIS — E119 Type 2 diabetes mellitus without complications: Secondary | ICD-10-CM | POA: Insufficient documentation

## 2017-02-22 DIAGNOSIS — R040 Epistaxis: Secondary | ICD-10-CM

## 2017-02-22 MED ORDER — PHENYLEPHRINE HCL 0.5 % NA SOLN
1.0000 [drp] | Freq: Once | NASAL | Status: AC
  Administered 2017-02-22: 1 [drp] via NASAL
  Filled 2017-02-22: qty 15

## 2017-02-22 NOTE — ED Triage Notes (Signed)
Pt states has had nosebleed x 2 hours. Pt states tried pinching and ice without relief.

## 2017-02-22 NOTE — Discharge Instructions (Signed)
Your nosebleed appears to be caused by a disruption to the small blood vessels in the tip of the nose. Use the nasal spray, noseclip, and dampened gauze as directed for active nosebleeds. Consider using OTC saline mist or gel (Ayr) to moisturize the nose. Return to the ED for any nosebleed lasting mor than 45 minutes. Follow-up with Dr. Andee PolesVaught as needed.

## 2017-02-22 NOTE — ED Provider Notes (Signed)
[facility] Emergency Department Provider Note ____________________________________________  Time seen: 1335  I have reviewed the triage vital signs and the nursing notes.  HISTORY  Chief Complaint  Epistaxis  HPI Vanessa Snow is a 56 y.o. female presents to the ED for evaluation of an acute nosebleed for the last 2 hours. Patient denies any resolution to her nosebleed after continuous icing and pinching the nose.She reports about 1 nosebleed per month over the last 3 months. She denies a history of recurrent or persistent nosebleeds. She endorses that usually she can abort the nosebleeds in about 15 minutes, but this one didn't stop. She is on Plavix and a daily ASA for A Fib. She denies URI symptoms, persistent seasonal allergies, or syncope.   Past Medical History:  Diagnosis Date  . CAD, multiple vessel, STEMI-DES-RCA 01/07/15, pLCX DES-01/08/15 01/09/2015  . Hyperlipidemia LDL goal <70 01/09/2015  . Hypertension   . Myocardial infarction    STEMI    Patient Active Problem List   Diagnosis Date Noted  . Postmenopausal bleeding 07/01/2016  . Hyperlipidemia LDL goal <70 01/09/2015  . DM2 (diabetes mellitus, type 2) (HCC) 01/09/2015  . CAD, multiple vessel, STEMI-DES-RCA 01/07/15, pLCX DES-01/08/15 01/09/2015  . ST-segment elevation myocardial infarction (STEMI) of inferior wall (HCC) 01/07/2015    Class: Acute  . Essential hypertension 01/07/2015  . Tobacco abuse 01/07/2015    Past Surgical History:  Procedure Laterality Date  . CARDIAC CATHETERIZATION  01/08/2015   stent to LCX, synergy DES  . CORONARY ANGIOPLASTY     stent to LCX, synergy DES  . CORONARY STENT PLACEMENT  01/07/2015   rca  DES, STEMI  . HYSTEROSCOPY W/D&C N/A 07/01/2016   Procedure: DILATATION AND CURETTAGE /HYSTEROSCOPY;  Surgeon: Nadara Mustard, MD;  Location: [facility] ORS;  Service: Gynecology;  Laterality: N/A;  . LEFT HEART CATHETERIZATION WITH CORONARY ANGIOGRAM N/A  01/07/2015   Procedure: LEFT HEART CATHETERIZATION WITH CORONARY ANGIOGRAM;  Surgeon: Lesleigh Noe, MD;  Location: [facility] CATH LAB;  Service: Cardiovascular;  Laterality: N/A;  . PERCUTANEOUS CORONARY STENT INTERVENTION (PCI-S) N/A 01/08/2015   Procedure: PERCUTANEOUS CORONARY STENT INTERVENTION (PCI-S);  Surgeon: Peter M Swaziland, MD;  Location: [facility] CATH LAB;  Service: Cardiovascular;  Laterality: N/A;  . TUBAL LIGATION      Prior to Admission medications   Medication Sig Start Date End Date Taking? Authorizing Provider  acetaminophen (TYLENOL) 325 MG tablet Take 2 tablets (650 mg total) by mouth every 4 (four) hours as needed for headache or mild pain. 01/09/15   Leone Brand, NP  aspirin 81 MG chewable tablet Chew 1 tablet (81 mg total) by mouth daily. 01/09/15   Leone Brand, NP  atorvastatin (LIPITOR) 40 MG tablet TAKE 1 TABLET BY MOUTH ONCE DAILY AT [facility] 11/10/16   Iran Ouch, MD  clopidogrel (PLAVIX) 75 MG tablet Take 1 tablet (75 mg total) by mouth daily. 09/19/16   Antonieta Iba, MD  Cyanocobalamin (VITAMIN B-12) 2500 MCG SUBL Place 2,500 mcg under the tongue daily.     Historical Provider, MD  ferrous sulfate 325 (65 FE) MG tablet Take 325 mg by mouth daily with breakfast.    Historical Provider, MD  losartan-hydrochlorothiazide (HYZAAR) 50-12.5 MG tablet TAKE 1 TABLET BY MOUTH DAILY. 01/27/17   Antonieta Iba, MD  metoprolol tartrate (LOPRESSOR) 25 MG tablet Take 1 tablet (25 mg total) by mouth 2 (two) times daily. 01/05/17   Antonieta Iba, MD  Multiple Vitamin (MULTIVITAMIN WITH  MINERALS) TABS tablet Take 1 tablet by mouth daily.    Historical Provider, MD  nitroGLYCERIN (NITROSTAT) 0.4 MG SL tablet Place 1 tablet (0.4 mg total) under the tongue every 5 (five) minutes as needed for chest pain. 01/09/15   Leone Brand, NP  oxyCODONE-acetaminophen (PERCOCET) 5-325 MG tablet Take 1 tablet by mouth every 4 (four) hours as needed for moderate pain or severe pain. 07/01/16   Nadara Mustard, MD  varenicline (CHANTIX CONTINUING MONTH PAK) 1 MG tablet Take 1 tablet (1 mg total) by mouth 2 (two) times daily. 09/19/16   Antonieta Iba, MD    Allergies Ace inhibitors; Sulfa antibiotics; and Shellfish allergy  Family History  Problem Relation Age of Onset  . Family history unknown: Yes    Social History Social History  Substance Use Topics  . Smoking status: Former Smoker    Packs/day: 0.50    Years: 30.00    Types: Cigarettes    Quit date: 01/07/2015  . Smokeless tobacco: Never Used  . Alcohol use No    Review of Systems  Constitutional: Negative for fever. Eyes: Negative for visual changes. ENT: Negative for sore throat. Reports nosebleed as above.  Cardiovascular: Negative for chest pain. Respiratory: Negative for shortness of breath. Gastrointestinal: Negative for abdominal pain, vomiting and diarrhea. Skin: Negative for rash. Neurological: Negative for headaches, focal weakness or numbness. ____________________________________________  PHYSICAL EXAM:  VITAL SIGNS: ED Triage Vitals  Enc Vitals Group     BP 02/22/17 1330 (!) 166/81     Pulse Rate 02/22/17 1330 73     Resp 02/22/17 1330 18     Temp 02/22/17 1330 97.9 F (36.6 C)     Temp Source 02/22/17 1330 Oral     SpO2 02/22/17 1330 96 %     Weight 02/22/17 1323 170 lb (77.1 kg)     Height 02/22/17 1323 5\' 2"  (1.575 m)     Head Circumference --      Peak Flow --      Pain Score --      Pain Loc --      Pain Edu? --      Excl. in GC? --     Constitutional: Alert and oriented. Well appearing and in no distress. Head: Normocephalic and atraumatic. Eyes: Conjunctivae are normal. PERRL. Normal extraocular movements Nose: No congestion/rhinorrhea. Nasal turbinates are pink and moist bilaterally. Patient with slow oozing of bright red blood to the septum at the right nare.  Mouth/Throat: Mucous membranes are moist. Cardiovascular: Normal rate, regular rhythm. Normal distal  pulses. Respiratory: Normal respiratory effort.  Musculoskeletal: Nontender with normal range of motion in all extremities.  Neurologic:  Normal gait without ataxia. Normal speech and language. No gross focal neurologic deficits are appreciated. Skin:  Skin is warm, dry and intact. No rash noted. Psychiatric: Mood and affect are normal. Patient exhibits appropriate insight and judgment. ____________________________________________  PROCEDURES  Nose Clip Neo synephrine ii gtts right nare ____________________________________________  INITIAL IMPRESSION / ASSESSMENT AND PLAN / ED COURSE  Patient with resolution of an acute right nare nosebleed in the ED. She tolerates CN menstruation Neo-Synephrine and a nose clip in the ED with good response. She is discharged with instructions on self management of nosebleeds. She is advised to over the next 24-40 hours avoid blowing the nose. She may use And sterile swab as necessary to clear the nostrils. She is discharged with Neo-Synephrine, nose clip, and gauze for anterior nose packing. She will  follow with a primary care provider or [facility] for ongoing or recurrent symptoms. ____________________________________________  FINAL CLINICAL IMPRESSION(S) / ED DIAGNOSES  Final diagnoses:  Epistaxis      Lissa HoardJenise V Bacon Darolyn Double, PA-C 02/22/17 1720    Minna AntisKevin Paduchowski, MD 02/23/17 1541

## 2017-02-23 ENCOUNTER — Telehealth: Payer: Self-pay | Admitting: Cardiovascular Disease

## 2017-02-23 NOTE — Telephone Encounter (Signed)
Pt calling stating for the past few months she's had some light nose bleed And this past weekend she had an episode  Where [facility]  She would like to know if she needs to continue her Asprin Please advise

## 2017-02-23 NOTE — Telephone Encounter (Signed)
Pt would like to know if she can hold her aspirin 2/2 frequent nosebleeds. Pt is also on Plavix, but would just like to see if she can hold her aspirin.

## 2017-02-24 NOTE — Telephone Encounter (Signed)
Wintertime is often difficult, dry air causing nosebleeds Could try nasal spray If needed could hold aspirin, stay on Plavix Stent is 56 years old and should do okay on Plavix alone

## 2017-02-24 NOTE — Telephone Encounter (Signed)
Spoke w/ pt.  Advised her of Dr. Gollan's recommendation.  She verbalizes understanding and will call back w/ any further questions or concerns.  

## 2017-04-08 ENCOUNTER — Other Ambulatory Visit: Payer: Self-pay | Admitting: Cardiovascular Disease

## 2017-05-23 ENCOUNTER — Other Ambulatory Visit: Payer: Self-pay | Admitting: Cardiovascular Disease

## 2017-07-14 ENCOUNTER — Other Ambulatory Visit: Payer: Self-pay | Admitting: Cardiovascular Disease

## 2017-08-04 ENCOUNTER — Other Ambulatory Visit: Payer: Self-pay | Admitting: Cardiovascular Disease

## 2017-08-19 ENCOUNTER — Other Ambulatory Visit: Payer: Self-pay | Admitting: Cardiovascular Disease

## 2017-09-13 ENCOUNTER — Other Ambulatory Visit: Payer: Self-pay | Admitting: Cardiovascular Disease

## 2017-10-16 ENCOUNTER — Other Ambulatory Visit: Payer: Self-pay | Admitting: Cardiovascular Disease

## 2017-11-17 ENCOUNTER — Other Ambulatory Visit: Payer: Self-pay | Admitting: Cardiovascular Disease

## 2018-01-02 NOTE — Progress Notes (Signed)
Cardiology Office Note  Date:  01/04/2018   ID:  Vanessa Snow, DOB December 13, 1961, MRN [id number]  PCP:  Dortha Kern, MD   Chief Complaint  Patient presents with  . OTHER    12 month f/u no complaints today. Meds reviewed verbally with pt.    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 she had DES to her RCA.  DES to her left circumflex. She has residual 30 and 40% proximal and mid LAD disease. Peak troponin of 6.  LV gram showing no wall motion abnormality.    hemoglobin A1c previously 6   PMH:   has a past medical history of CAD, multiple vessel, STEMI-DES-RCA 01/07/15, pLCX DES-01/08/15 (01/09/2015), Hyperlipidemia LDL goal <70 (01/09/2015), Hypertension, and Myocardial infarction (HCC).  PSH:    Past Surgical History:  Procedure Laterality Date  . CARDIAC CATHETERIZATION  01/08/2015   stent to LCX, synergy DES  . CORONARY ANGIOPLASTY     stent to LCX, synergy DES  . CORONARY STENT PLACEMENT  01/07/2015   rca  DES, STEMI  . HYSTEROSCOPY W/D&C N/A 07/01/2016   Procedure: DILATATION AND CURETTAGE /HYSTEROSCOPY;  Surgeon: Nadara Mustard, MD;  Location: [facility] ORS;  Service: Gynecology;  Laterality: N/A;  . LEFT HEART CATHETERIZATION WITH CORONARY ANGIOGRAM N/A 01/07/2015   Procedure: LEFT HEART CATHETERIZATION WITH CORONARY ANGIOGRAM;  Surgeon: Lesleigh Noe, MD;  Location: [facility] CATH LAB;  Service: Cardiovascular;  Laterality: N/A;  . PERCUTANEOUS CORONARY STENT INTERVENTION (PCI-S) N/A 01/08/2015   Procedure: PERCUTANEOUS CORONARY STENT INTERVENTION (PCI-S);  Surgeon: Peter M Swaziland, MD;  Location: [facility] CATH LAB;  Service: Cardiovascular;  Laterality: N/A;  . TUBAL LIGATION      Current Outpatient Medications  Medication Sig Dispense Refill  . acetaminophen (TYLENOL) 325 MG tablet Take 2 tablets (650 mg total) by mouth every 4 (four) hours as needed for headache or mild pain.    Marland Kitchen aspirin 81 MG chewable tablet Chew 1 tablet (81 mg total) by mouth daily.    Marland Kitchen atorvastatin (LIPITOR) 40 MG tablet TAKE 1 TABLET BY MOUTH ONCE DAILY AT 6PM 30 tablet 2  . atorvastatin (LIPITOR) 40 MG tablet TAKE 1 TABLET BY MOUTH ONCE DAILY AT 6PM 30 tablet 2  . clopidogrel (PLAVIX) 75 MG tablet TAKE 1 TABLET (75 MG TOTAL) BY MOUTH DAILY. 90 tablet 3  . Cyanocobalamin (VITAMIN B-12) 2500 MCG SUBL Place 2,500 mcg under the tongue daily.     . ferrous sulfate 325 (65 FE) MG tablet Take 325 mg by mouth daily with breakfast.    . losartan-hydrochlorothiazide (HYZAAR) 50-12.5 MG tablet TAKE 1 TABLET BY MOUTH DAILY.  90 tablet 3  . metoprolol tartrate (LOPRESSOR) 25 MG tablet TAKE 1 TABLET BY MOUTH TWICE A DAY 60 tablet 3  . Multiple Vitamin (MULTIVITAMIN WITH MINERALS) TABS tablet Take 1 tablet by mouth daily.    . nitroGLYCERIN (NITROSTAT) 0.4 MG SL tablet Place 1 tablet (0.4 mg total) under the tongue every 5 (five) minutes as needed for chest pain. 25 tablet 4  . oxyCODONE-acetaminophen (PERCOCET) 5-325 MG tablet Take 1 tablet by mouth every 4 (four) hours as needed for moderate  pain or severe pain. 30 tablet 0  . Potassium 99 MG TABS Take by mouth daily.    . varenicline (CHANTIX CONTINUING MONTH PAK) 1 MG tablet Take 1 tablet (1 mg total) by mouth 2 (two) times daily. 60 tablet 3   No current facility-administered medications for this visit.      Allergies:   Ace inhibitors; Sulfa antibiotics; and Shellfish allergy   Social History:  The patient  reports that she quit smoking about 2 years ago. Her smoking use included cigarettes. She has a 15.00 pack-year smoking history. she has never used smokeless tobacco. She reports that she does not drink alcohol or use drugs.   Family History:   Family history is unknown by patient.    Review of Systems: Review of Systems  Constitutional: Negative.        Weight gain  Respiratory: Negative.   Cardiovascular: Negative.   Gastrointestinal: Negative.   Musculoskeletal: Negative.   Neurological: Negative.   Psychiatric/Behavioral: Negative.   All other systems reviewed and are negative.    PHYSICAL EXAM: VS:  BP 118/90 (BP Location: Left Arm, Patient Position: Sitting, Cuff Size: Large)   Pulse (!) 59   Ht 5' 2.5" (1.588 m)   Wt 217 lb (98.4 kg)   BMI 39.06 kg/m  , BMI Body mass index is 39.06 kg/m. Constitutional:  oriented to person, place, and time. No distress.  obese HENT:  Head: Normocephalic and atraumatic.  Eyes:  no discharge. No scleral icterus.  Neck: Normal range of motion. Neck supple. No JVD present.  Cardiovascular: Normal rate, regular rhythm, normal heart sounds and intact distal pulses. Exam reveals no gallop and no friction rub. No edema No murmur heard. Pulmonary/Chest: Effort normal and breath sounds normal. No stridor. No respiratory distress.  no wheezes.  no rales.  no tenderness.  Abdominal: Soft.  no distension.  no tenderness.  Musculoskeletal: Normal range of motion.  no  tenderness or deformity.  Neurological:  normal muscle tone. Coordination normal. No atrophy Skin: Skin is  warm and dry. No rash noted. not diaphoretic.  Psychiatric:  normal mood and affect. behavior is normal. Thought content normal.      Recent Labs: No results found for requested labs within last 8760 hours.    Lipid Panel Lab Results  Component Value Date   CHOL 148 09/19/2016   HDL 59 09/19/2016   LDLCALC 75 09/19/2016   TRIG 71 09/19/2016      Wt Readings from Last 3 Encounters:  01/04/18 217 lb (98.4 kg)  02/22/17 170 lb (77.1 kg)  09/19/16 206 lb 8 oz (93.7 kg)       ASSESSMENT AND PLAN:  CAD, multiple vessel, STEMI-DES-RCA 01/07/15, pLCX DES-01/08/15  Currently with no symptoms of angina. No further workup at this time. Continue current medication regimen. Stable Orders for labs today  Essential hypertension -  We will check BMP today Blood pressure is well controlled on today's visit. No changes made to  the medications.  Hyperlipidemia LDL goal <70 - Plan: Hepatic function panel, Lipid Profile, HgB A1c, Basic Metabolic Panel (BMET) No recent lab work available, lipid panel drawn today  Type 2 diabetes mellitus with other circulatory complication, without long-term current use of insulin (HCC) - significant weight gain, eating more, less exercise Long discussion concerning her diet, low carbohydrates needed  Tobacco abuse - Stop smoking 2017 with Chantix  Discussed flu shot   Total encounter time more than 25 minutes  Greater than 50% was spent in counseling and coordination of care with the patient  Disposition:   F/U  12 months   Orders Placed This Encounter  Procedures  . EKG 12-Lead     Signed, Dossie Arbourim Posey Jasmin, M.D., Ph.D. 01/04/2018  [facility], [place] [phone]

## 2018-01-04 ENCOUNTER — Encounter: Payer: Self-pay | Admitting: Cardiovascular Disease

## 2018-01-04 ENCOUNTER — Ambulatory Visit (INDEPENDENT_AMBULATORY_CARE_PROVIDER_SITE_OTHER): Payer: BLUE CROSS/BLUE SHIELD | Admitting: Cardiovascular Disease

## 2018-01-04 VITALS — BP 118/90 | HR 59 | Ht 62.5 in | Wt 217.0 lb

## 2018-01-04 DIAGNOSIS — E1159 Type 2 diabetes mellitus with other circulatory complications: Secondary | ICD-10-CM

## 2018-01-04 DIAGNOSIS — Z720 Tobacco use: Secondary | ICD-10-CM

## 2018-01-04 DIAGNOSIS — E785 Hyperlipidemia, unspecified: Secondary | ICD-10-CM

## 2018-01-04 DIAGNOSIS — Z23 Encounter for immunization: Secondary | ICD-10-CM | POA: Diagnosis not present

## 2018-01-04 DIAGNOSIS — I10 Essential (primary) hypertension: Secondary | ICD-10-CM | POA: Diagnosis not present

## 2018-01-04 DIAGNOSIS — I2119 ST elevation (STEMI) myocardial infarction involving other coronary artery of inferior wall: Secondary | ICD-10-CM | POA: Diagnosis not present

## 2018-01-04 MED ORDER — METOPROLOL TARTRATE 25 MG PO TABS: 25.0000 mg | ORAL_TABLET | Freq: Two times a day (BID) | ORAL | 3 refills | Status: DC

## 2018-01-04 MED ORDER — LOSARTAN POTASSIUM-HCTZ 50-12.5 MG PO TABS: 1.0000 | ORAL_TABLET | Freq: Every day | ORAL | 3 refills | Status: DC

## 2018-01-04 MED ORDER — ATORVASTATIN CALCIUM 40 MG PO TABS: ORAL_TABLET | ORAL | 2 refills | Status: DC

## 2018-01-04 MED ORDER — CLOPIDOGREL BISULFATE 75 MG PO TABS: 75.0000 mg | ORAL_TABLET | Freq: Every day | ORAL | 3 refills | Status: DC

## 2018-01-04 NOTE — Patient Instructions (Addendum)
Medication Instructions:   No medication changes made  Labwork:  BMP, lipids, LFT, HBA1C today (fasting)  Testing/Procedures:  No further testing at this time   Follow-Up: It was a pleasure seeing you in the office today. Please call us if you have new issues that need to be addressed before your next appt.  [phone]  Your physician wants you to follow-up in: 12 months.  You will receive a reminder letter in the mail two months in advance. If you don't receive a letter, please call our office to schedule the follow-up appointment.  If you need a refill on your cardiac medications before your next appointment, please call your pharmacy.

## 2018-01-05 ENCOUNTER — Telehealth: Payer: Self-pay | Admitting: Cardiovascular Disease

## 2018-01-05 LAB — HEPATIC FUNCTION PANEL
ALBUMIN: 4.1 g/dL (ref 3.5–5.5)
ALT: 14 IU/L (ref 0–32)
AST: 18 IU/L (ref 0–40)
Alkaline Phosphatase: 89 IU/L (ref 39–117)
Bilirubin Total: 0.6 mg/dL (ref 0.0–1.2)
Bilirubin, Direct: 0.17 mg/dL (ref 0.00–0.40)
TOTAL PROTEIN: 7.5 g/dL (ref 6.0–8.5)

## 2018-01-05 LAB — BASIC METABOLIC PANEL
BUN / CREAT RATIO: 15 (ref 9–23)
BUN: 12 mg/dL (ref 6–24)
CALCIUM: 9.2 mg/dL (ref 8.7–10.2)
CO2: 24 mmol/L (ref 20–29)
Chloride: 104 mmol/L (ref 96–106)
Creatinine, Ser: 0.79 mg/dL (ref 0.57–1.00)
GFR calc non Af Amer: 84 mL/min/{1.73_m2} (ref >59)
GFR, EST AFRICAN AMERICAN: 97 mL/min/{1.73_m2} (ref >59)
Glucose: 108 mg/dL — ABNORMAL HIGH (ref 65–99)
POTASSIUM: 4.7 mmol/L (ref 3.5–5.2)
Sodium: 143 mmol/L (ref 134–144)

## 2018-01-05 LAB — LIPID PANEL
Chol/HDL Ratio: 3.8 ratio (ref 0.0–4.4)
Cholesterol, Total: 237 mg/dL — ABNORMAL HIGH (ref 100–199)
HDL: 63 mg/dL (ref >39)
LDL Calculated: 150 mg/dL — ABNORMAL HIGH (ref 0–99)
Triglycerides: 120 mg/dL (ref 0–149)
VLDL CHOLESTEROL CAL: 24 mg/dL (ref 5–40)

## 2018-01-05 LAB — HEMOGLOBIN A1C
ESTIMATED AVERAGE GLUCOSE: 126 mg/dL
HEMOGLOBIN A1C: 6 % — AB (ref 4.8–5.6)

## 2018-01-05 NOTE — Telephone Encounter (Signed)
LMOV verifying all cardiac medications were sent to [facility] in [place]. Let her know to call back if she had any other questions.

## 2018-01-05 NOTE — Telephone Encounter (Signed)
Pt wanted to make sure we sent her medication refills to [facility] [place].

## 2018-04-05 ENCOUNTER — Other Ambulatory Visit: Payer: Self-pay | Admitting: Cardiovascular Disease

## 2018-09-29 ENCOUNTER — Other Ambulatory Visit: Payer: Self-pay | Admitting: Obstetrics and Gynecology

## 2018-09-29 DIAGNOSIS — N6320 Unspecified lump in the left breast, unspecified quadrant: Secondary | ICD-10-CM

## 2018-10-12 ENCOUNTER — Other Ambulatory Visit: Payer: BLUE CROSS/BLUE SHIELD

## 2018-10-15 ENCOUNTER — Ambulatory Visit
Admission: RE | Admit: 2018-10-15 | Discharge: 2018-10-15 | Disposition: A | Discharge status: Home / Self Care | Payer: BLUE CROSS/BLUE SHIELD | Source: Ambulatory Visit | Attending: Obstetrics and Gynecology | Admitting: Obstetrics and Gynecology

## 2018-10-15 DIAGNOSIS — N6320 Unspecified lump in the left breast, unspecified quadrant: Secondary | ICD-10-CM

## 2018-12-05 ENCOUNTER — Other Ambulatory Visit: Payer: Self-pay | Admitting: Cardiovascular Disease

## 2019-01-23 NOTE — Progress Notes (Signed)
Cardiology Office Note  Date:  01/25/2019   ID:  Vanessa Snow, DOB [birth year], MRN [id number]  PCP:  Arne ClevelandMartinez, Lisa Mariah, MD   Chief Complaint  Patient presents with  . other    12 month f/u no complaints today. Meds reviewed verbally with pt.    HPI:  [name] is a 58 year old woman with history of  Coronary artery disease,  STEMI, stent 2 to her RCA and left circumflex, 2016 chronic back pain,  smoking, stopped 10/2016  hyperlipidemia,  hypertension  who presents for routine follow-up of her coronary artery disease  INTERVAL HISTORY:  The patient reports today for follow up. She is doing well overall, adding she has cutt out carbs from her diet. She feels she has lost weight.  Wonders if the scale today is inaccurate, weight is actually running higher No regular exercise program  She endorses cramping in her arms Tolerating Lipitor 40 otherwise  She is on her feet five days a week working in [employer].  10 hours a day  Blood pressure 116/84  Lab work reviewed in detail with her Total Chol 173/ LDL 74, on Lipitor, does not miss any days HBA1C 5.9 CR 0.9 Glucose 97  EKG personally reviewed by myself on todays visit Shows normal rhythm. 68 bpm.  Concern for LVH by voltage criteria  OTHER PAST MEDICAL HISTORY REVIEWED BY ME FOR TODAY'S VISIT:   admission to the hospital for STEMI January 2016, subtotally occluded RCA in the mid to distal region also with left circumflex disease. she had DES to her RCA.  DES to her left circumflex. She has residual 30 and 40% proximal and mid LAD disease. Peak troponin of 6.  LV gram showing no wall motion abnormality.    hemoglobin A1c previously 6   PMH:   has a past medical history of CAD, multiple vessel, STEMI-DES-RCA 01/07/15, pLCX DES-01/08/15 (01/09/2015), Hyperlipidemia LDL goal <70 (01/09/2015), Hypertension, and Myocardial infarction (HCC).  PSH:    Past Surgical History:  Procedure Laterality Date   . CARDIAC CATHETERIZATION  01/08/2015   stent to LCX, synergy DES  . CORONARY ANGIOPLASTY     stent to LCX, synergy DES  . CORONARY STENT PLACEMENT  01/07/2015   rca  DES, STEMI  . HYSTEROSCOPY W/D&C N/A 07/01/2016   Procedure: DILATATION AND CURETTAGE /HYSTEROSCOPY;  Surgeon: Nadara Mustardobert P Harris, MD;  Location: [facility] ORS;  Service: Gynecology;  Laterality: N/A;  . LEFT HEART CATHETERIZATION WITH CORONARY ANGIOGRAM N/A 01/07/2015   Procedure: LEFT HEART CATHETERIZATION WITH CORONARY ANGIOGRAM;  Surgeon: Lesleigh NoeHenry W Smith III, MD;  [facility];  Service: Cardiovascular;  Laterality: N/A;  . PERCUTANEOUS CORONARY STENT INTERVENTION (PCI-S) N/A 01/08/2015   Procedure: PERCUTANEOUS CORONARY STENT INTERVENTION (PCI-S);  Surgeon: Peter M SwazilandJordan, MD;  [facility];  Service: Cardiovascular;  Laterality: N/A;  . TUBAL LIGATION      Current Outpatient Medications  Medication Sig Dispense Refill  . acetaminophen (TYLENOL) 325 MG tablet Take 2 tablets (650 mg total) by mouth every 4 (four) hours as needed for headache or mild pain.    Marland Kitchen. aspirin 81 MG chewable tablet Chew 1 tablet (81 mg total) by mouth daily.    Marland Kitchen. atorvastatin (LIPITOR) 40 MG tablet TAKE 1 TABLET BY MOUTH ONCE DAILY AT 6PM 30 tablet 2  . clopidogrel (PLAVIX) 75 MG tablet Take 1 tablet (75 mg total) by mouth daily. 90 tablet 3  . Cyanocobalamin (VITAMIN B-12) 2500 MCG SUBL Place 2,500 mcg under the  tongue daily.     . ferrous sulfate 325 (65 FE) MG tablet Take 325 mg by mouth daily with breakfast.    . losartan-hydrochlorothiazide (HYZAAR) 50-12.5 MG tablet Take 1 tablet by mouth daily. 90 tablet 3  . metoprolol tartrate (LOPRESSOR) 25 MG tablet Take 1 tablet (25 mg total) by mouth 2 (two) times daily. 180 tablet 3  . Multiple Vitamin (MULTIVITAMIN WITH MINERALS) TABS tablet Take 1 tablet by mouth daily.    . nitroGLYCERIN (NITROSTAT) 0.4 MG SL tablet Place 1 tablet (0.4 mg total) under the tongue every 5 (five) minutes as  needed for chest pain. 25 tablet 4  . oxyCODONE-acetaminophen (PERCOCET) 5-325 MG tablet Take 1 tablet by mouth every 4 (four) hours as needed for moderate pain or severe pain. 30 tablet 0  . Potassium 99 MG TABS Take by mouth daily.    Marland Kitchen ezetimibe (ZETIA) 10 MG tablet Take 1 tablet (10 mg total) by mouth daily. 90 tablet 3   No current facility-administered medications for this visit.      Allergies:   Ace inhibitors; Sulfa antibiotics; and Shellfish allergy   Social History:  The patient  reports that she quit smoking about 4 years ago. Her smoking use included cigarettes. She has a 15.00 pack-year smoking history. She has never used smokeless tobacco. She reports that she does not drink alcohol or use drugs.   Family History:   Family history is unknown by patient.    Review of Systems: Review of Systems  Constitutional: Negative.        Weight gain  Respiratory: Negative.  Negative for shortness of breath.   Cardiovascular: Negative.  Negative for chest pain.  Gastrointestinal: Negative.   Musculoskeletal: Negative.   Neurological: Negative.   Psychiatric/Behavioral: Negative.   All other systems reviewed and are negative.   PHYSICAL EXAM: VS:  BP 116/84 (BP Location: Left Arm, Patient Position: Sitting, Cuff Size: Large)   Pulse 68   Ht 5\' 3"  (1.6 m)   Wt 221 lb 4 oz (100.4 kg)   BMI 39.19 kg/m  , BMI Body mass index is 39.19 kg/m.   Constitutional:  oriented to person, place, and time. No distress.  HENT:  Head: Grossly normal Eyes:  no discharge. No scleral icterus.  Neck: No JVD, no carotid bruits  Cardiovascular: Regular rate and rhythm, no murmurs appreciated Pulmonary/Chest: Clear to auscultation bilaterally, no wheezes or rails Abdominal: Soft.  no distension.  no tenderness.  Musculoskeletal: Normal range of motion Neurological:  normal muscle tone. Coordination normal. No atrophy Skin: Skin warm and dry Psychiatric: normal affect, pleasant  Recent  Labs: No results found for requested labs within last 8760 hours.    Lipid Panel Lab Results  Component Value Date   CHOL 237 (H) 01/04/2018   HDL 63 01/04/2018   LDLCALC 150 (H) 01/04/2018   TRIG 120 01/04/2018  Was not on Lipitor at the time of this lab work    [employer] Readings from Last 3 Encounters:  01/25/19 221 lb 4 oz (100.4 kg)  01/04/18 217 lb (98.4 kg)  02/22/17 170 lb (77.1 kg)       ASSESSMENT AND PLAN:  CAD, multiple vessel, STEMI-DES-RCA 01/07/15, pLCX DES-01/08/15  Plan: Currently with no symptoms of angina. No further workup at this time. Continue current medication regimen. Stable  Essential hypertension Plan: Stable  Blood pressure is well controlled on today's visit. No changes made to the medications. Consider doubling potassium for low potassium 3.6 on HCTZ  May need to hold HCTZ in the summertime when perspiring at work  Hyperlipidemia  Plan: Total chol 173/ LDL 74 Continue taking medication. Recommend adding Zetia 10mg   daily Repeat lipids at next visit with PCP. Goal LDL 60  Type 2 diabetes mellitus with other circulatory complication, without long-term current use of insulin (HCC) Plan: Continue cutting out carbs. Exercising more   Tobacco abuse Stop smoking 2017 with Chantix Did not restart smoking  Total encounter time more than 25 minutes Greater than 50% was spent in counseling and coordination of care with the patient  Disposition:   F/U  12 months   Orders Placed This Encounter  Procedures  . EKG 12-Lead    I, Diona Browner am acting as a scribe for Julien Nordmann, M.D., Ph.D.  I have reviewed the above documentation for accuracy and completeness, and I agree with the above.   Signed, Dossie Arbour, M.D., Ph.D. 01/25/2019  [facility] [place], [place] [phone]

## 2019-01-25 ENCOUNTER — Ambulatory Visit: Payer: BLUE CROSS/BLUE SHIELD | Admitting: Cardiovascular Disease

## 2019-01-25 ENCOUNTER — Encounter: Payer: Self-pay | Admitting: Cardiovascular Disease

## 2019-01-25 VITALS — BP 116/84 | HR 68 | Ht 63.0 in | Wt 221.2 lb

## 2019-01-25 DIAGNOSIS — E785 Hyperlipidemia, unspecified: Secondary | ICD-10-CM

## 2019-01-25 DIAGNOSIS — I25118 Atherosclerotic heart disease of native coronary artery with other forms of angina pectoris: Secondary | ICD-10-CM | POA: Diagnosis not present

## 2019-01-25 DIAGNOSIS — E1159 Type 2 diabetes mellitus with other circulatory complications: Secondary | ICD-10-CM | POA: Diagnosis not present

## 2019-01-25 DIAGNOSIS — Z720 Tobacco use: Secondary | ICD-10-CM

## 2019-01-25 DIAGNOSIS — I10 Essential (primary) hypertension: Secondary | ICD-10-CM

## 2019-01-25 MED ORDER — EZETIMIBE 10 MG PO TABS: 10.0000 mg | ORAL_TABLET | Freq: Every day | ORAL | 3 refills | Status: DC

## 2019-01-25 NOTE — Patient Instructions (Addendum)
Medication Instructions:  Please start zetia one a day, 10 mg   Consider doubling the potassium  If you need a refill on your cardiac medications before your next appointment, please call your pharmacy.    Lab work: No new labs needed   If you have labs (blood work) drawn today and your tests are completely normal, you will receive your results only by: Marland Kitchen MyChart Message (if you have MyChart) OR . A paper copy in the mail If you have any lab test that is abnormal or we need to change your treatment, we will call you to review the results.   Testing/Procedures: No new testing needed   Follow-Up: At [facility], you and your health needs are our priority.  As part of our continuing mission to provide you with exceptional heart care, we have created designated Provider Care Teams.  These Care Teams include your primary Cardiologist (physician) and Advanced Practice Providers (APPs -  Physician Assistants and Nurse Practitioners) who all work together to provide you with the care you need, when you need it.  . You will need a follow up appointment in 12 months .   Please call our office 2 months in advance to schedule this appointment.    . Providers on your designated Care Team:   . Nicolasa Ducking, NP . Eula Listen, PA-C . Marisue Ivan, PA-C  Any Other Special Instructions Will Be Listed Below (If Applicable).  For educational health videos Log in to : [email or web address] Or : [email or web address], password : [email or web address]

## 2019-02-19 ENCOUNTER — Other Ambulatory Visit: Payer: Self-pay | Admitting: Cardiovascular Disease

## 2019-03-18 ENCOUNTER — Other Ambulatory Visit: Payer: Self-pay

## 2019-03-18 MED ORDER — CLOPIDOGREL BISULFATE 75 MG PO TABS: 75.0000 mg | ORAL_TABLET | Freq: Every day | ORAL | 3 refills | Status: DC

## 2019-03-18 MED ORDER — LOSARTAN POTASSIUM-HCTZ 50-12.5 MG PO TABS: 1.0000 | ORAL_TABLET | Freq: Every day | ORAL | 3 refills | Status: DC

## 2019-03-18 NOTE — Telephone Encounter (Signed)
Requested Prescriptions   Signed Prescriptions Disp Refills  . clopidogrel (PLAVIX) 75 MG tablet 90 tablet 3    Sig: Take 1 tablet (75 mg total) by mouth daily.    Authorizing Provider: GOLLAN, TIMOTHY J    Ordering User: Arjay Jaskiewicz N    

## 2019-06-26 ENCOUNTER — Encounter: Payer: Self-pay | Admitting: Emergency Medicine

## 2019-06-26 ENCOUNTER — Emergency Department
Admission: EM | Admit: 2019-06-26 | Discharge: 2019-06-27 | Disposition: A | Discharge status: Home / Self Care | Payer: BC Managed Care – PPO | Attending: Emergency Medicine | Admitting: Emergency Medicine

## 2019-06-26 ENCOUNTER — Emergency Department: Payer: BC Managed Care – PPO

## 2019-06-26 ENCOUNTER — Other Ambulatory Visit: Payer: Self-pay

## 2019-06-26 DIAGNOSIS — I2510 Atherosclerotic heart disease of native coronary artery without angina pectoris: Secondary | ICD-10-CM | POA: Insufficient documentation

## 2019-06-26 DIAGNOSIS — Z87891 Personal history of nicotine dependence: Secondary | ICD-10-CM | POA: Diagnosis not present

## 2019-06-26 DIAGNOSIS — I252 Old myocardial infarction: Secondary | ICD-10-CM | POA: Diagnosis not present

## 2019-06-26 DIAGNOSIS — R070 Pain in throat: Secondary | ICD-10-CM | POA: Diagnosis not present

## 2019-06-26 DIAGNOSIS — I10 Essential (primary) hypertension: Secondary | ICD-10-CM | POA: Diagnosis not present

## 2019-06-26 DIAGNOSIS — Z955 Presence of coronary angioplasty implant and graft: Secondary | ICD-10-CM | POA: Diagnosis not present

## 2019-06-26 DIAGNOSIS — Z03818 Encounter for observation for suspected exposure to other biological agents ruled out: Secondary | ICD-10-CM | POA: Diagnosis not present

## 2019-06-26 DIAGNOSIS — L299 Pruritus, unspecified: Secondary | ICD-10-CM | POA: Insufficient documentation

## 2019-06-26 DIAGNOSIS — E119 Type 2 diabetes mellitus without complications: Secondary | ICD-10-CM | POA: Insufficient documentation

## 2019-06-26 DIAGNOSIS — X58XXXA Exposure to other specified factors, initial encounter: Secondary | ICD-10-CM | POA: Insufficient documentation

## 2019-06-26 DIAGNOSIS — T7840XA Allergy, unspecified, initial encounter: Secondary | ICD-10-CM

## 2019-06-26 DIAGNOSIS — Z79899 Other long term (current) drug therapy: Secondary | ICD-10-CM | POA: Insufficient documentation

## 2019-06-26 DIAGNOSIS — R0602 Shortness of breath: Secondary | ICD-10-CM | POA: Diagnosis present

## 2019-06-26 MED ORDER — FAMOTIDINE IN NACL 20-0.9 MG/50ML-% IV SOLN
20.0000 mg | Freq: Once | INTRAVENOUS | Status: AC
  Administered 2019-06-27: 20 mg via INTRAVENOUS
  Filled 2019-06-26: qty 50

## 2019-06-26 MED ORDER — EPINEPHRINE 0.3 MG/0.3ML IJ SOAJ
0.3000 mg | Freq: Once | INTRAMUSCULAR | Status: AC
  Administered 2019-06-27: 0.3 mg via INTRAMUSCULAR
  Filled 2019-06-26: qty 0.3

## 2019-06-26 MED ORDER — METHYLPREDNISOLONE SODIUM SUCC 125 MG IJ SOLR
125.0000 mg | Freq: Once | INTRAMUSCULAR | Status: AC
  Administered 2019-06-27: 125 mg via INTRAVENOUS
  Filled 2019-06-26: qty 2

## 2019-06-26 MED ORDER — DIPHENHYDRAMINE HCL 50 MG/ML IJ SOLN
25.0000 mg | Freq: Once | INTRAMUSCULAR | Status: AC
  Administered 2019-06-27: 25 mg via INTRAVENOUS
  Filled 2019-06-26: qty 1

## 2019-06-26 MED ORDER — SODIUM CHLORIDE 0.9 % IV BOLUS
1000.0000 mL | Freq: Once | INTRAVENOUS | Status: AC
  Administered 2019-06-27: 1000 mL via INTRAVENOUS

## 2019-06-26 NOTE — ED Provider Notes (Signed)
[facility] Emergency Department Provider Note   ____________________________________________   First MD Initiated Contact with Patient 06/26/19 2338     (approximate)  I have reviewed the triage vital signs and the nursing notes.   HISTORY  Chief Complaint Shortness of Breath    HPI Vanessa Snow is a 58 y.o. female who presents to the ED from home with a chief complaint of sudden onset shortness of breath and throat constriction.  Patient was eating some yogurt and granola when she felt like her throat was closing up.  Also felt some wheezing.  At the same time she felt like her feet were itchy and red.  Denies fever, cough, abdominal pain, nausea, vomiting, diarrhea.       Past Medical History:  Diagnosis Date   CAD, multiple vessel, STEMI-DES-RCA 01/07/15, pLCX DES-01/08/15 01/09/2015   Hyperlipidemia LDL goal <70 01/09/2015   Hypertension    Myocardial infarction [facility](HCC)    STEMI    Patient Active Problem List   Diagnosis Date Noted   Postmenopausal bleeding 07/01/2016   Hyperlipidemia LDL goal <70 01/09/2015   DM2 (diabetes mellitus, type 2) (HCC) 01/09/2015   CAD, multiple vessel, STEMI-DES-RCA 01/07/15, pLCX DES-01/08/15 01/09/2015   ST-segment elevation myocardial infarction (STEMI) of inferior wall (HCC) 01/07/2015    Class: Acute   Essential hypertension 01/07/2015   Tobacco abuse 01/07/2015    Past Surgical History:  Procedure Laterality Date   CARDIAC CATHETERIZATION  01/08/2015   stent to LCX, synergy DES   CORONARY ANGIOPLASTY     stent to LCX, synergy DES   CORONARY STENT PLACEMENT  01/07/2015   rca  DES, STEMI   HYSTEROSCOPY W/D&C N/A 07/01/2016   Procedure: DILATATION AND CURETTAGE /HYSTEROSCOPY;  Surgeon: Nadara Mustardobert P Harris, MD;  Location: [facility] ORS;  Service: Gynecology;  Laterality: N/A;   LEFT HEART CATHETERIZATION WITH CORONARY ANGIOGRAM N/A 01/07/2015   Procedure: LEFT HEART CATHETERIZATION WITH  CORONARY ANGIOGRAM;  Surgeon: Lesleigh NoeHenry W Smith III, MD;  Location: [facility] CATH LAB;  Service: Cardiovascular;  Laterality: N/A;   PERCUTANEOUS CORONARY STENT INTERVENTION (PCI-S) N/A 01/08/2015   Procedure: PERCUTANEOUS CORONARY STENT INTERVENTION (PCI-S);  Surgeon: Peter M SwazilandJordan, MD;  [facility];  Service: Cardiovascular;  Laterality: N/A;   TUBAL LIGATION      Prior to Admission medications   Medication Sig Start Date End Date Taking? Authorizing Provider  acetaminophen (TYLENOL) 325 MG tablet Take 2 tablets (650 mg total) by mouth every 4 (four) hours as needed for headache or mild pain. 01/09/15   Leone BrandIngold, Laura R, NP  aspirin 81 MG chewable tablet Chew 1 tablet (81 mg total) by mouth daily. 01/09/15   Leone BrandIngold, Laura R, NP  atorvastatin (LIPITOR) 40 MG tablet TAKE 1 TABLET BY MOUTH ONCE DAILY AT 6PM 08/04/17   Iran OuchArida, Muhammad A, MD  clopidogrel (PLAVIX) 75 MG tablet Take 1 tablet (75 mg total) by mouth daily. 03/18/19   Antonieta IbaGollan, Timothy J, MD  Cyanocobalamin (VITAMIN B-12) 2500 MCG SUBL Place 2,500 mcg under the tongue daily.     [provider]  EPINEPHrine 0.3 mg/0.3 mL IJ SOAJ injection Inject 0.3 mLs (0.3 mg total) into the muscle once for 1 dose. 06/27/19 06/27/19  Irean HongSung, Britlee Skolnik J, MD  ezetimibe (ZETIA) 10 MG tablet Take 1 tablet (10 mg total) by mouth daily. 01/25/19 04/25/19  Antonieta IbaGollan, Timothy J, MD  famotidine (PEPCID) 20 MG tablet Take 1 tablet (20 mg total) by mouth 2 (two) times daily. 06/27/19  Paulette Blanch, MD  ferrous sulfate 325 (65 FE) MG tablet Take 325 mg by mouth daily with breakfast.    [provider]  losartan-hydrochlorothiazide (HYZAAR) 50-12.5 MG tablet Take 1 tablet by mouth daily. 03/18/19   Minna Merritts, MD  metoprolol tartrate (LOPRESSOR) 25 MG tablet TAKE 1 TABLET BY MOUTH TWICE A DAY 02/21/19   Minna Merritts, MD  Multiple Vitamin (MULTIVITAMIN WITH MINERALS) TABS tablet Take 1 tablet by mouth daily.    [provider]  nitroGLYCERIN  (NITROSTAT) 0.4 MG SL tablet Place 1 tablet (0.4 mg total) under the tongue every 5 (five) minutes as needed for chest pain. 01/09/15   Isaiah Serge, NP  oxyCODONE-acetaminophen (PERCOCET) 5-325 MG tablet Take 1 tablet by mouth every 4 (four) hours as needed for moderate pain or severe pain. 07/01/16   Gae Dry, MD  Potassium 99 MG TABS Take by mouth daily.    [provider]  predniSONE (DELTASONE) 20 MG tablet 3 tablets PO qd x 4 days 06/27/19   Paulette Blanch, MD    Allergies Ace inhibitors, Sulfa antibiotics, and Shellfish allergy  Family History  Family history unknown: Yes    Social History Social History   Tobacco Use   Smoking status: Former Smoker    Packs/day: 0.50    Years: 30.00    Pack years: 15.00    Types: Cigarettes    Quit date: 01/07/2015    Years since quitting: 4.4   Smokeless tobacco: Never Used  Substance Use Topics   Alcohol use: No    Alcohol/week: 0.0 standard drinks   Drug use: No    Review of Systems  Constitutional: No fever/chills Eyes: No visual changes. ENT: Positive for sensation of throat constriction.  No sore throat. Cardiovascular: Denies chest pain. Respiratory: Positive for wheezing.  Denies shortness of breath. Gastrointestinal: No abdominal pain.  No nausea, no vomiting.  No diarrhea.  No constipation. Genitourinary: Negative for dysuria. Musculoskeletal: Negative for back pain. Skin: Positive for rash. Neurological: Negative for headaches, focal weakness or numbness.   ____________________________________________   PHYSICAL EXAM:  VITAL SIGNS: ED Triage Vitals  Enc Vitals Group     BP 06/26/19 2332 (!) 197/99     Pulse Rate 06/26/19 2331 67     Resp 06/26/19 2331 20     Temp 06/26/19 2331 98.2 F (36.8 C)     Temp Source 06/26/19 2331 Oral     SpO2 06/26/19 2331 99 %     Weight 06/26/19 2330 189 lb (85.7 kg)     Height 06/26/19 2330 5' 2.5" (1.588 m)     Head Circumference --      Peak Flow --        Pain Score 06/26/19 2330 0     Pain Loc --      Pain Edu? --      Excl. in [place]? --     Constitutional: Alert and oriented. Well appearing and in mild acute distress. Eyes: Conjunctivae are normal. PERRL. EOMI. Head: Atraumatic. Nose: No congestion/rhinnorhea. Mouth/Throat: Mucous membranes are moist.  There is no tongue or lip angioedema.  There is no hoarse or muffled voice.  There is no drooling. Neck: No stridor.   Cardiovascular: Normal rate, regular rhythm. Grossly normal heart sounds.  Good peripheral circulation. Respiratory: Normal respiratory effort.  No retractions. Lungs CTAB. Gastrointestinal: Soft and nontender. No distention. No abdominal bruits. No CVA tenderness. Musculoskeletal: No lower extremity tenderness nor edema.  No joint effusions. Neurologic:  Normal speech and language. No gross focal neurologic deficits are appreciated.  Skin:  Skin is warm, dry and intact.  Mild hives/redness noted to soles of feet which patient states are itchy. Psychiatric: Mood and affect are normal. Speech and behavior are normal.  ____________________________________________   LABS (all labs ordered are listed, but only abnormal results are displayed)  Labs Reviewed  BASIC METABOLIC PANEL - Abnormal; Notable for the following components:      Result Value   Glucose, Bld 107 (*)    Calcium 8.8 (*)    All other components within normal limits  SARS CORONAVIRUS 2 (HOSPITAL ORDER, PERFORMED IN [facility])  CBC  TROPONIN I (HIGH SENSITIVITY)   ____________________________________________  EKG  ED ECG REPORT I, Jaan Fischel J, the attending physician, personally viewed and interpreted this ECG.   Date: 06/26/2019  EKG Time: 2332  Rate: 67  Rhythm: normal EKG, normal sinus rhythm  Axis: Normal  Intervals:none  ST&T Change: Nonspecific  ____________________________________________  RADIOLOGY  ED MD interpretation: Chest x-ray demonstrates questionable  right sided air space opacity; CT chest and neck unremarkable for foreign body, mass or inflammatory process  Official radiology report(s): Dg Chest 2 View  Result Date: 06/27/2019 CLINICAL DATA:  Shortness of breath EXAM: CHEST - 2 VIEW COMPARISON:  None. FINDINGS: Mild cardiomegaly. Diffuse bilateral interstitial opacity. Vague opacity over the lateral right lower lung. No pneumothorax. IMPRESSION: 1. Mild diffuse interstitial opacity of uncertain chronicity, could be due to interstitial inflammatory process 2. Questionable vague airspace opacity in the right lateral lung base 3. Mild cardiomegaly Electronically Signed   By: Jasmine PangKim  Fujinaga M.D.   On: 06/27/2019 00:20   Ct Soft Tissue Neck Wo Contrast  Result Date: 06/27/2019 CLINICAL DATA:  58 y/o F; sudden onset shortness of breath and feels like she cannot swallow. EXAM: CT NECK AND CHEST WITHOUT CONTRAST COMPARISON:  None. FINDINGS: CT NECK FINDINGS Pharynx and larynx: Normal. No mass or swelling. Salivary glands: No inflammation, mass, or stone. Thyroid: Normal. Lymph nodes: None enlarged or abnormal density. Vascular: Mild calcific atherosclerosis of the carotid systems. Limited intracranial: Negative. Visualized orbits: Negative. Mastoids and visualized paranasal sinuses: Clear. Skeleton: No acute or aggressive process. Mild discogenic degenerative changes at the C4-C6 levels with small endplate marginal osteophytes. Upper chest: As below. Other: None. CT CHEST FINDINGS Cardiovascular: Mild cardiomegaly. Coronary artery stents and calcific atherosclerosis. Normal caliber thoracic aorta and main pulmonary artery. Mild aortic calcific atherosclerosis. Mediastinum/Nodes: No enlarged mediastinal or axillary lymph nodes. Thyroid gland, trachea, and esophagus demonstrate no significant findings. Lungs/Pleura: Mild smooth interlobular septal thickening. No consolidation, effusion, or pneumothorax. Upper Abdomen: No acute abnormality. Musculoskeletal: No  chest wall mass or suspicious bone lesions identified. IMPRESSION: 1. No mass or inflammatory process of the neck identified. 2. Mild cardiomegaly. Mild coronary artery, carotid systems, and aortic calcific atherosclerosis. 3. Mild interstitial pulmonary edema. Electronically Signed   By: Mitzi HansenLance  Furusawa-Stratton M.D.   On: 06/27/2019 01:26   Ct Chest Wo Contrast  Result Date: 06/27/2019 CLINICAL DATA:  58 y/o F; sudden onset shortness of breath and feels like she cannot swallow. EXAM: CT NECK AND CHEST WITHOUT CONTRAST COMPARISON:  None. FINDINGS: CT NECK FINDINGS Pharynx and larynx: Normal. No mass or swelling. Salivary glands: No inflammation, mass, or stone. Thyroid: Normal. Lymph nodes: None enlarged or abnormal density. Vascular: Mild calcific atherosclerosis of the carotid systems. Limited intracranial: Negative. Visualized orbits: Negative. Mastoids and visualized paranasal sinuses: Clear. Skeleton: No  acute or aggressive process. Mild discogenic degenerative changes at the C4-C6 levels with small endplate marginal osteophytes. Upper chest: As below. Other: None. CT CHEST FINDINGS Cardiovascular: Mild cardiomegaly. Coronary artery stents and calcific atherosclerosis. Normal caliber thoracic aorta and main pulmonary artery. Mild aortic calcific atherosclerosis. Mediastinum/Nodes: No enlarged mediastinal or axillary lymph nodes. Thyroid gland, trachea, and esophagus demonstrate no significant findings. Lungs/Pleura: Mild smooth interlobular septal thickening. No consolidation, effusion, or pneumothorax. Upper Abdomen: No acute abnormality. Musculoskeletal: No chest wall mass or suspicious bone lesions identified. IMPRESSION: 1. No mass or inflammatory process of the neck identified. 2. Mild cardiomegaly. Mild coronary artery, carotid systems, and aortic calcific atherosclerosis. 3. Mild interstitial pulmonary edema. Electronically Signed   By: Mitzi HansenLance  Furusawa-Stratton M.D.   On: 06/27/2019 01:26     ____________________________________________   PROCEDURES  Procedure(s) performed (including Critical Care):  Procedures   ____________________________________________   INITIAL IMPRESSION / ASSESSMENT AND PLAN / ED COURSE  As part of my medical decision making, I reviewed the following data within the electronic MEDICAL RECORD NUMBER Nursing notes reviewed and incorporated, Labs reviewed, EKG interpreted, Old chart reviewed, Radiograph reviewed and Notes from prior ED visits     Vanessa Snow was evaluated in Emergency Department on 06/27/2019 for the symptoms described in the history of present illness. She was evaluated in the context of the global COVID-19 pandemic, which necessitated consideration that the patient might be at risk for infection with the SARS-CoV-2 virus that causes COVID-19. Institutional protocols and algorithms that pertain to the evaluation of patients at risk for COVID-19 are in a state of rapid change based on information released by regulatory bodies including the CDC and federal and state organizations. These policies and algorithms were followed during the patient's care in the ED.   58 year old female who presents with sudden onset shortness of breath and sensation of throat constriction. Differential includes, but is not limited to, anaphylaxis, viral syndrome, bronchitis including COPD exacerbation, pneumonia, reactive airway disease including asthma, CHF including exacerbation with or without pulmonary/interstitial edema, pneumothorax, ACS, thoracic trauma, and pulmonary embolism.  Will administer IV allergic reaction cocktail including fluids, Solu-Medrol, Benadryl and Pepcid.  Will administer EpiPen.  Patient has a cardiac history; will check lab work including troponin and EKG.  Will monitor in the ED for minimum of 3 hours.   Clinical Course as of Jun 26 444  Mon Jun 27, 2019  0102 Ordered CT scan without contrast based on patient's chest  x-ray abnormality.  Currently she feels like she cannot swallow and has heightened anxiety.  Reassessed oropharynx which does not show tongue or lip angioedema.  Posterior oropharynx unremarkable.  Will add Decadron and noncontrast CT soft tissue neck.  States that she has never had issues with esophageal stricture.   [JS]  0320 Patient states she is feeling better.  Room air saturations 98%.  Reexamined oropharynx which is without tongue or lip angioedema.  Water given to patient.  If she is able to tolerate without feeling of throat constriction/dysphagia, anticipate discharge home.   [JS]  [id number] Patient tolerated water without difficulty. Feels significantly better. No swelling of her tongue or lips. No hoarse or muffled voice. Tolerated secretions well. Never complained of chest pain. Updated her of CT and Covid results. Will discharge home with prescriptions for Prednisone, Pepcid and Epi pen. Strict return precautions given. Patient verbalizes understanding and agrees with plan of care.   [JS]    Clinical Course User Index [JS] Irean HongSung, Riddhi Grether J, MD  ____________________________________________   FINAL CLINICAL IMPRESSION(S) / ED DIAGNOSES  Final diagnoses:  Allergic reaction, initial encounter     ED Discharge Orders         Ordered    predniSONE (DELTASONE) 20 MG tablet     06/27/19 0336    famotidine (PEPCID) 20 MG tablet  2 times daily     06/27/19 0336    EPINEPHrine 0.3 mg/0.3 mL IJ SOAJ injection   Once     06/27/19 1610           Note:  This document was prepared using Dragon voice recognition software and may include unintentional dictation errors.   Irean Hong, MD 06/27/19 [phone]

## 2019-06-26 NOTE — ED Notes (Signed)
Patient transported to X-ray 

## 2019-06-26 NOTE — ED Triage Notes (Addendum)
Patient with complaint of sudden onset of shortness of breath that started about 30 minutes ago. Patient denies chest pain. Patient states that she feels like her throat is closing up.

## 2019-06-27 ENCOUNTER — Emergency Department: Payer: BC Managed Care – PPO

## 2019-06-27 LAB — BASIC METABOLIC PANEL WITH GFR
Anion gap: 8 (ref 5–15)
BUN: 17 mg/dL (ref 6–20)
CO2: 26 mmol/L (ref 22–32)
Calcium: 8.8 mg/dL — ABNORMAL LOW (ref 8.9–10.3)
Chloride: 104 mmol/L (ref 98–111)
Creatinine, Ser: 1 mg/dL (ref 0.44–1.00)
GFR calc Af Amer: >60 mL/min (ref >60)
GFR calc non Af Amer: >60 mL/min (ref >60)
Glucose, Bld: 107 mg/dL — ABNORMAL HIGH (ref 70–99)
Potassium: 3.7 mmol/L (ref 3.5–5.1)
Sodium: 138 mmol/L (ref 135–145)

## 2019-06-27 LAB — CBC
HCT: 41.4 % (ref 36.0–46.0)
Hemoglobin: 13.3 g/dL (ref 12.0–15.0)
MCH: 27.8 pg (ref 26.0–34.0)
MCHC: 32.1 g/dL (ref 30.0–36.0)
MCV: 86.4 fL (ref 80.0–100.0)
Platelets: 198 10*3/uL (ref 150–400)
RBC: 4.79 MIL/uL (ref 3.87–5.11)
RDW: 14.3 % (ref 11.5–15.5)
WBC: 8 10*3/uL (ref 4.0–10.5)
nRBC: 0 % (ref 0.0–0.2)

## 2019-06-27 LAB — TROPONIN I (HIGH SENSITIVITY): Troponin I (High Sensitivity): 7 ng/L (ref <18)

## 2019-06-27 LAB — SARS CORONAVIRUS 2 BY RT PCR (HOSPITAL ORDER, PERFORMED IN ~~LOC~~ HOSPITAL LAB): SARS Coronavirus 2: NEGATIVE

## 2019-06-27 MED ORDER — FAMOTIDINE 20 MG PO TABS: 20.0000 mg | ORAL_TABLET | Freq: Two times a day (BID) | ORAL | 0 refills | Status: DC

## 2019-06-27 MED ORDER — PREDNISONE 20 MG PO TABS: ORAL_TABLET | ORAL | 0 refills | Status: DC

## 2019-06-27 MED ORDER — DEXAMETHASONE SODIUM PHOSPHATE 10 MG/ML IJ SOLN
10.0000 mg | Freq: Once | INTRAMUSCULAR | Status: AC
  Administered 2019-06-27: 10 mg via INTRAVENOUS
  Filled 2019-06-27: qty 1

## 2019-06-27 MED ORDER — EPINEPHRINE 0.3 MG/0.3ML IJ SOAJ: 0.3000 mg | Freq: Once | INTRAMUSCULAR | 2 refills | Status: AC

## 2019-06-27 NOTE — ED Notes (Signed)
Pt reports her throat is feeling "like I can not swallow" again. Dr Beather Arbour informed and in to reassess pt.

## 2019-06-27 NOTE — Discharge Instructions (Signed)
1. Take the following medicines for the next 4 days: Prednisone 60mg daily Pepcid 20mg twice daily 2. Take Benadryl as needed for itching. 3. Use Epi-Pen in case of acute, life-threatening allergic reaction. 4. Return to the ER for worsening symptoms, persistent vomiting, difficulty breathing or other concerns.  

## 2019-06-27 NOTE — ED Notes (Signed)
Pt to CT

## 2019-10-04 ENCOUNTER — Other Ambulatory Visit: Payer: Self-pay | Admitting: Family Medicine

## 2019-10-04 DIAGNOSIS — Z1231 Encounter for screening mammogram for malignant neoplasm of breast: Secondary | ICD-10-CM

## 2019-10-11 NOTE — Progress Notes (Unsigned)
Patient calling to check the status  Please call to discuss

## 2019-10-11 NOTE — Telephone Encounter (Signed)
mychart message received from patient with BP readings,  "A.M. one hour after meds   120/70 72,  184/93 65,  147/80 76,  160/79 73,  130/63 74,  142/79 74,  145/82 66  P.M two hours after meds   128/75 67,  149/83 79,  148/73 75,  136/66 76,  152/81 98,  146/81 78,  149/64 77"  Routing to provider to advise.

## 2019-10-16 ENCOUNTER — Other Ambulatory Visit: Payer: Self-pay | Admitting: Cardiovascular Disease

## 2019-10-16 NOTE — Telephone Encounter (Signed)
For high blood pressure with increasing the losartan HCTZ 50/12.5 daily up to 100/25 mg daily Would increase her potassium intake Could consider taking 1 over-the-counter potassium pill daily

## 2019-10-24 ENCOUNTER — Other Ambulatory Visit: Payer: Self-pay | Admitting: *Deleted

## 2019-10-24 MED ORDER — LOSARTAN POTASSIUM-HCTZ 100-25 MG PO TABS: 1.0000 | ORAL_TABLET | Freq: Every day | ORAL | 2 refills | Status: DC

## 2019-11-02 ENCOUNTER — Telehealth: Payer: Self-pay | Admitting: Cardiovascular Disease

## 2019-11-02 NOTE — Telephone Encounter (Signed)
Patient calling  Patient sees Dr Augusto Garbe at [facility] and he requested she find out if she can take Meloxicam medication along with Plavix Please call to discuss

## 2019-11-02 NOTE — Telephone Encounter (Signed)
Spoke with patient and she is needing something for inflammation and they inquired if she could take Meloxicam. Reviewed that we do not recommend use of that with plavix due to increased risk of bleeding and that they should try something different. She will reach back out to them about this and will see what other recommendations they have. She was appreciative for the call with no further questions at this time.

## 2019-11-22 NOTE — Progress Notes (Signed)
Cardiology Office Note  Date:  11/23/2019   ID:  Vanessa Snow, DOB December 21, 1960, MRN [id number]  PCP:  Vanessa Peers, MD   Chief Complaint  Patient presents with  . other    6 month follow up. Meds reviewed by the pt. verbally. Pt. c/o not feeling great but was on prednisone for 6 days.     HPI:  [name] is a 58 year old woman with history of  Coronary artery disease,  STEMI, stent 2 to her RCA and left circumflex, 2016 chronic back pain,  smoking, stopped 10/2016  hyperlipidemia,  hypertension  who presents for routine follow-up of her coronary artery disease  Couple weeks ago very tired Was on prednisone pack for arthritis Symptoms have resolved after stopping the prednisone  Some discomfort under breasts at times Hurt shoulder at work, radiation into back  Prior anginal symptoms with severe back pain, has not had any recent similar symptoms  Bothered by weight gain No significant myalgias from Lipitor Zetia  Works in Psychologist, educational, 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 she had DES to her RCA.  DES to her left circumflex. She has residual 30 and 40% proximal and mid LAD disease. Peak troponin of 6.  LV gram showing no wall motion abnormality.    PMH:   has a past medical history of CAD, multiple vessel, STEMI-DES-RCA 01/07/15, pLCX DES-01/08/15 (01/09/2015), Hyperlipidemia LDL goal <70 (01/09/2015), Hypertension, and Myocardial infarction (Vanessa Snow).  PSH:    Past Surgical History:  Procedure Laterality Date  . CARDIAC CATHETERIZATION  01/08/2015   stent to LCX, synergy DES  . CORONARY ANGIOPLASTY      stent to LCX, synergy DES  . CORONARY STENT PLACEMENT  01/07/2015   rca  DES, STEMI  . HYSTEROSCOPY W/D&C N/A 07/01/2016   Procedure: DILATATION AND CURETTAGE /HYSTEROSCOPY;  Surgeon: Gae Dry, MD;  Location: [facility] ORS;  Service: Gynecology;  Laterality: N/A;  . LEFT HEART CATHETERIZATION WITH CORONARY ANGIOGRAM N/A 01/07/2015   Procedure: LEFT HEART CATHETERIZATION WITH CORONARY ANGIOGRAM;  Surgeon: Sinclair Grooms, MD;  Location: [facility] CATH LAB;  Service: Cardiovascular;  Laterality: N/A;  . PERCUTANEOUS CORONARY STENT INTERVENTION (PCI-S) N/A 01/08/2015   Procedure: PERCUTANEOUS CORONARY STENT INTERVENTION (PCI-S);  Surgeon: Peter M Martinique, MD;  Location: [facility] CATH LAB;  Service: Cardiovascular;  Laterality: N/A;  . TUBAL LIGATION      Current Outpatient Medications  Medication Sig Dispense Refill  . acetaminophen (TYLENOL) 325 MG tablet Take 2 tablets (650 mg total) by mouth every 4 (four) hours as needed for headache or mild pain.    Marland Kitchen aspirin 81 MG chewable tablet Chew 1 tablet (81 mg total) by mouth daily.    Marland Kitchen atorvastatin (LIPITOR) 40 MG tablet TAKE 1 TABLET BY MOUTH ONCE DAILY AT 6PM 90 tablet 0  . clopidogrel (PLAVIX) 75 MG tablet Take 1 tablet (75 mg total) by mouth daily. 90 tablet 3  . Cyanocobalamin (VITAMIN B-12) 2500 MCG SUBL Place 2,500 mcg under the tongue daily.     Marland Kitchen ezetimibe (ZETIA) 10 MG tablet Take 1 tablet (10 mg total) by mouth daily. 90 tablet 3  .  ferrous sulfate 325 (65 FE) MG tablet Take 325 mg by mouth daily with breakfast.    . losartan-hydrochlorothiazide (HYZAAR) 100-25 MG tablet Take 1 tablet by mouth daily. 90 tablet 2  . metoprolol tartrate (LOPRESSOR) 25 MG tablet TAKE 1 TABLET BY MOUTH TWICE A DAY 180 tablet 3  . Multiple Vitamin (MULTIVITAMIN WITH MINERALS) TABS tablet Take 1 tablet by mouth daily.    . nitroGLYCERIN (NITROSTAT) 0.4 MG SL tablet Place 1 tablet (0.4 mg total) under the tongue every 5 (five) minutes as needed for chest pain. 25 tablet 4   . Potassium 99 MG TABS Take by mouth daily.    Marland Kitchen oxyCODONE-acetaminophen (PERCOCET) 5-325 MG tablet Take 1 tablet by mouth every 4 (four) hours as needed for moderate pain or severe pain. (Patient not taking: Reported on 11/23/2019) 30 tablet 0   No current facility-administered medications for this visit.      Allergies:   Ace inhibitors, Sulfa antibiotics, Other, and Shellfish allergy   Social History:  The patient  reports that she quit smoking about 4 years ago. Her smoking use included cigarettes. She has a 15.00 pack-year smoking history. She has never used smokeless tobacco. She reports that she does not drink alcohol or use drugs.   Family History:   Family history is unknown by patient.    Review of Systems: Review of Systems  Constitutional: Negative.        Weight gain  Respiratory: Negative.  Negative for shortness of breath.   Cardiovascular: Negative.  Negative for chest pain.  Gastrointestinal: Negative.   Musculoskeletal: Negative.   Neurological: Negative.   Psychiatric/Behavioral: Negative.   All other systems reviewed and are negative.   PHYSICAL EXAM: VS:  BP 120/80 (BP Location: Left Arm, Patient Position: Sitting, Cuff Size: Large)   Pulse (!) 58   Temp (!) 97.5 F (36.4 C)   Ht 5' 2.5" (1.588 m)   Wt 229 lb (103.9 kg)   BMI 41.22 kg/m  , BMI Body mass index is 41.22 kg/m.  Constitutional:  oriented to person, place, and time. No distress.  HENT:  Head: Grossly normal Eyes:  no discharge. No scleral icterus.  Neck: No JVD, no carotid bruits  Cardiovascular: Regular rate and rhythm, no murmurs appreciated Pulmonary/Chest: Clear to auscultation bilaterally, no wheezes or rails Abdominal: Soft.  no distension.  no tenderness.  Musculoskeletal: Normal range of motion Neurological:  normal muscle tone. Coordination normal. No atrophy Skin: Skin warm and dry Psychiatric: normal affect, pleasant   Recent Labs: 06/26/2019: BUN 17; Creatinine, Ser  1.00; Hemoglobin 13.3; Platelets 198; Potassium 3.7; Sodium 138    Lipid Panel Lab Results  Component Value Date   CHOL 237 (H) 01/04/2018   HDL 63 01/04/2018   LDLCALC 150 (H) 01/04/2018   TRIG 120 01/04/2018  Was not on Lipitor at the time of this lab work    [employer] Readings from Last 3 Encounters:  11/23/19 229 lb (103.9 kg)  06/26/19 189 lb (85.7 kg)  01/25/19 221 lb 4 oz (100.4 kg)       ASSESSMENT AND PLAN:  CAD, multiple vessel, STEMI-DES-RCA 01/07/15, pLCX DES-01/08/15  Long discussion concerning prior anginal symptoms, recent symptoms of fatigue now resolved, atypical chest pain under the breasts, right side chest discomfort No testing ordered at this time but recommend she call us if symptoms get worse, testing could be ordered  Essential hypertension Blood pressure well controlled, no medication changes made  Hyperlipidemia  Excellent cholesterol numbers, no  changes made, discussed with her  Type 2 diabetes mellitus with other circulatory complication, without long-term current use of insulin (HCC) Excellent diabetes numbers Recommended regular walking program, dietary changes  Tobacco abuse Stop smoking 2017 with Chantix Non-smoker again confirmed  Total encounter time more than 25 minutes Greater than 50% was spent in counseling and coordination of care with the patient  Disposition:   F/U  12 months   Orders Placed This Encounter  Procedures  . EKG 12-Lead    I, Diona BrownerJennifer Gorman am acting as a scribe for Julien Nordmannimothy Saiquan Hands, M.D., Ph.D.  I have reviewed the above documentation for accuracy and completeness, and I agree with the above.   Signed, Dossie Arbourim Javeria Briski, M.D., Ph.D. 11/23/2019  [facility], [place] [phone]

## 2019-11-23 ENCOUNTER — Ambulatory Visit
Admission: RE | Admit: 2019-11-23 | Discharge: 2019-11-23 | Disposition: A | Discharge status: Home / Self Care | Payer: BC Managed Care – PPO | Source: Ambulatory Visit | Attending: Family Medicine | Admitting: Family Medicine

## 2019-11-23 ENCOUNTER — Other Ambulatory Visit: Payer: Self-pay

## 2019-11-23 ENCOUNTER — Encounter

## 2019-11-23 ENCOUNTER — Encounter: Payer: Self-pay | Admitting: Cardiovascular Disease

## 2019-11-23 ENCOUNTER — Ambulatory Visit (INDEPENDENT_AMBULATORY_CARE_PROVIDER_SITE_OTHER): Payer: BC Managed Care – PPO | Admitting: Cardiovascular Disease

## 2019-11-23 VITALS — BP 120/80 | HR 58 | Temp 97.5°F | Ht 62.5 in | Wt 229.0 lb

## 2019-11-23 DIAGNOSIS — E1159 Type 2 diabetes mellitus with other circulatory complications: Secondary | ICD-10-CM | POA: Diagnosis not present

## 2019-11-23 DIAGNOSIS — E785 Hyperlipidemia, unspecified: Secondary | ICD-10-CM

## 2019-11-23 DIAGNOSIS — I25118 Atherosclerotic heart disease of native coronary artery with other forms of angina pectoris: Secondary | ICD-10-CM

## 2019-11-23 DIAGNOSIS — I10 Essential (primary) hypertension: Secondary | ICD-10-CM

## 2019-11-23 DIAGNOSIS — Z1231 Encounter for screening mammogram for malignant neoplasm of breast: Secondary | ICD-10-CM | POA: Diagnosis present

## 2019-11-23 DIAGNOSIS — Z720 Tobacco use: Secondary | ICD-10-CM

## 2019-11-23 NOTE — Patient Instructions (Signed)
Medication Instructions:  OK TO HOLD PLAVIX  If you need a refill on your cardiac medications before your next appointment, please call your pharmacy.    Lab work: No new labs needed   If you have labs (blood work) drawn today and your tests are completely normal, you will receive your results only by: Marland Kitchen MyChart Message (if you have MyChart) OR . A paper copy in the mail If you have any lab test that is abnormal or we need to change your treatment, we will call you to review the results.   Testing/Procedures: No new testing needed   Follow-Up: At [facility], you and your health needs are our priority.  As part of our continuing mission to provide you with exceptional heart care, we have created designated Provider Care Teams.  These Care Teams include your primary Cardiologist (physician) and Advanced Practice Providers (APPs -  Physician Assistants and Nurse Practitioners) who all work together to provide you with the care you need, when you need it.  . You will need a follow up appointment in 12 months .   Please call our office 2 months in advance to schedule this appointment.    . Providers on your designated Care Team:   . Murray Hodgkins, NP . Christell Faith, PA-C . Marrianne Mood, PA-C  Any Other Special Instructions Will Be Listed Below (If Applicable).  For educational health videos Log in to : [email or web address] Or : [email or web address], password : [email or web address]

## 2020-01-21 ENCOUNTER — Other Ambulatory Visit: Payer: Self-pay | Admitting: Cardiovascular Disease

## 2020-02-06 ENCOUNTER — Other Ambulatory Visit: Payer: Self-pay | Admitting: Cardiovascular Disease

## 2020-02-17 ENCOUNTER — Other Ambulatory Visit: Payer: Self-pay | Admitting: Cardiovascular Disease

## 2020-05-15 ENCOUNTER — Other Ambulatory Visit: Payer: Self-pay | Admitting: Cardiovascular Disease

## 2020-07-15 ENCOUNTER — Other Ambulatory Visit: Payer: Self-pay | Admitting: Cardiovascular Disease

## 2020-08-18 ENCOUNTER — Other Ambulatory Visit: Payer: Self-pay | Admitting: Cardiovascular Disease

## 2020-10-29 IMAGING — MG MM DIGITAL DIAGNOSTIC BILAT W/ TOMO W/ CAD
8 series · 8 of 24 positions shown · non-contrast
Comparison: No priors

CLINICAL DATA: 57-year-old patient presents for evaluation of a
palpable area of concern in the 11 o'clock position of the left
breast.

EXAM:
DIGITAL DIAGNOSTIC BILATERAL MAMMOGRAM WITH CAD AND TOMO
ULTRASOUND LEFT BREAST

[R MLO synth-2D]
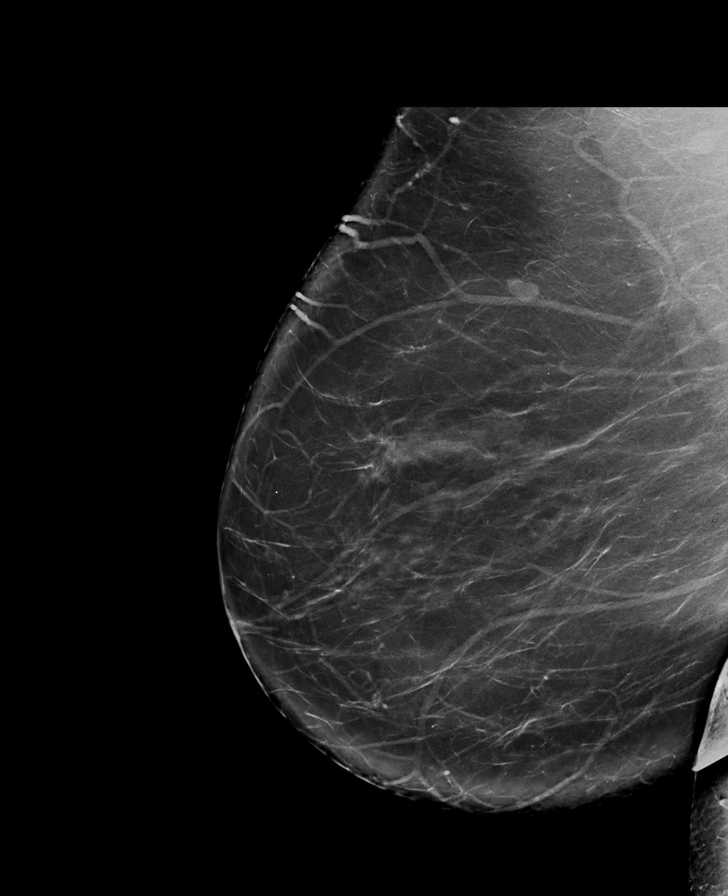

[L CC synth-2D]
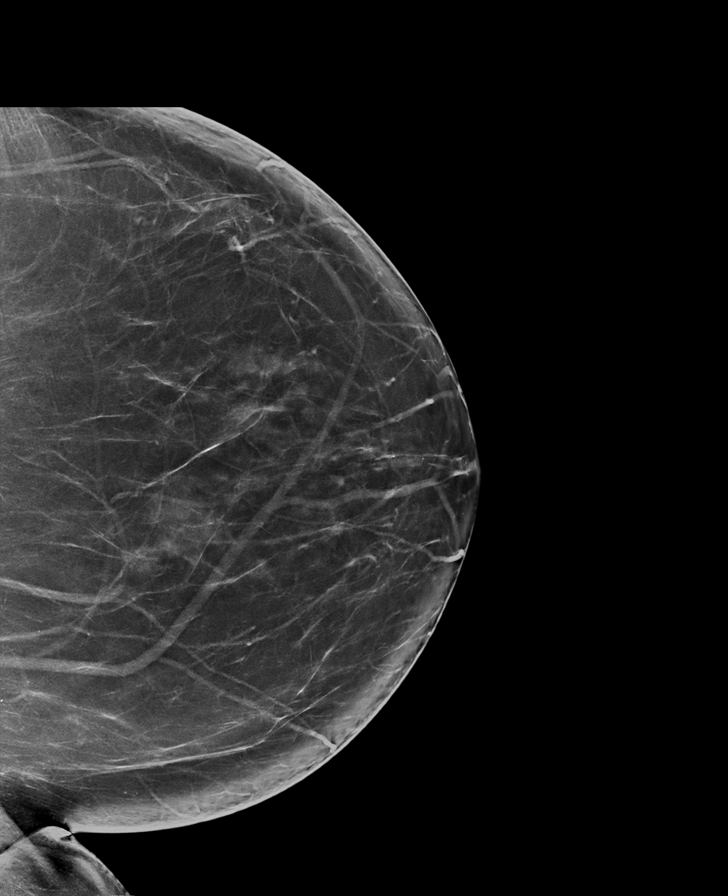

[L MLO synth-2D]
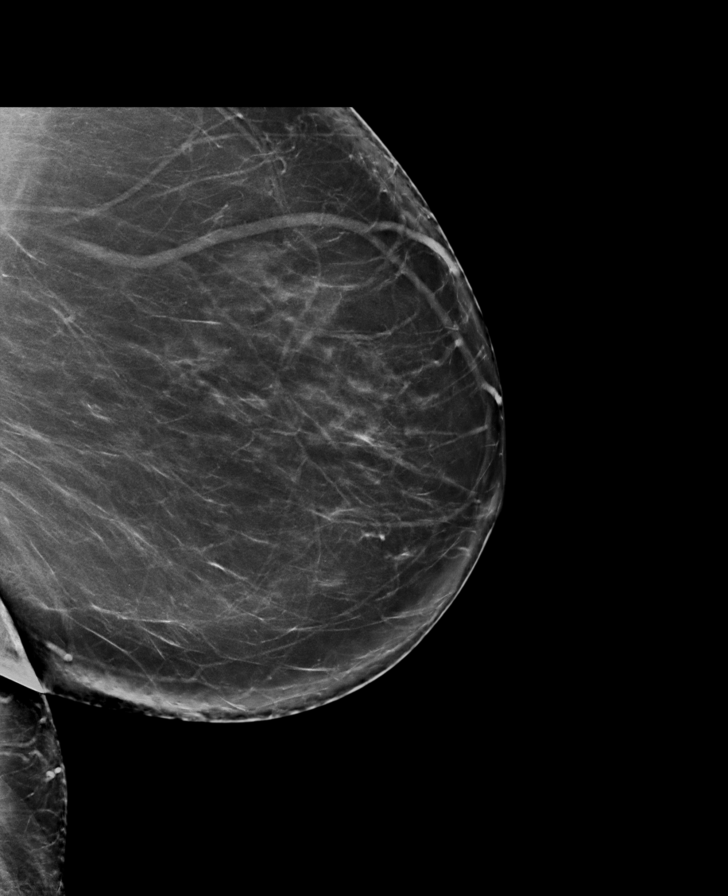

[R CC synth-2D]
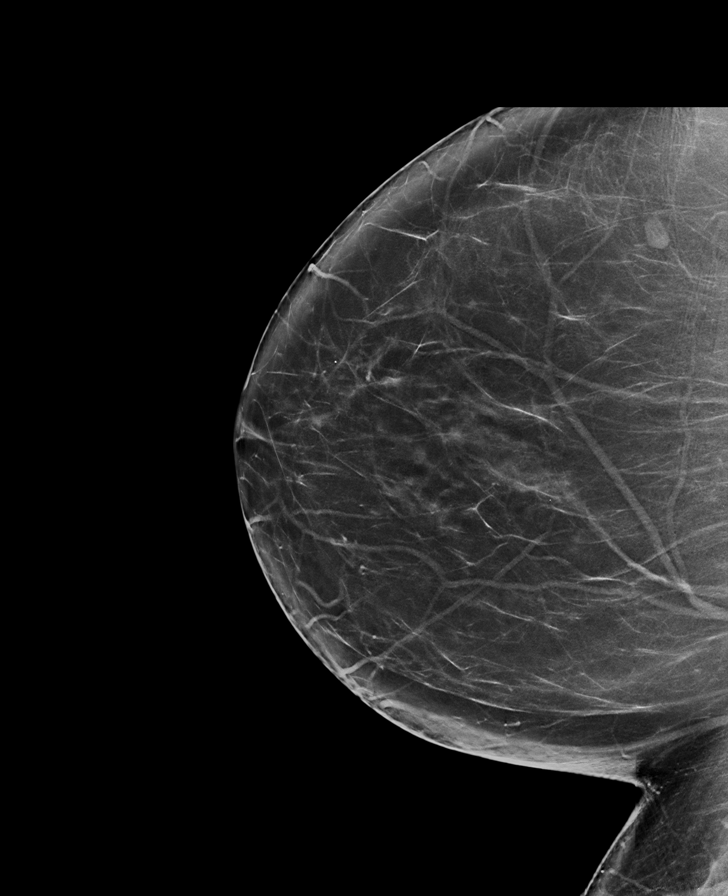

[L CC tomo · tomo slice 46/91.0]
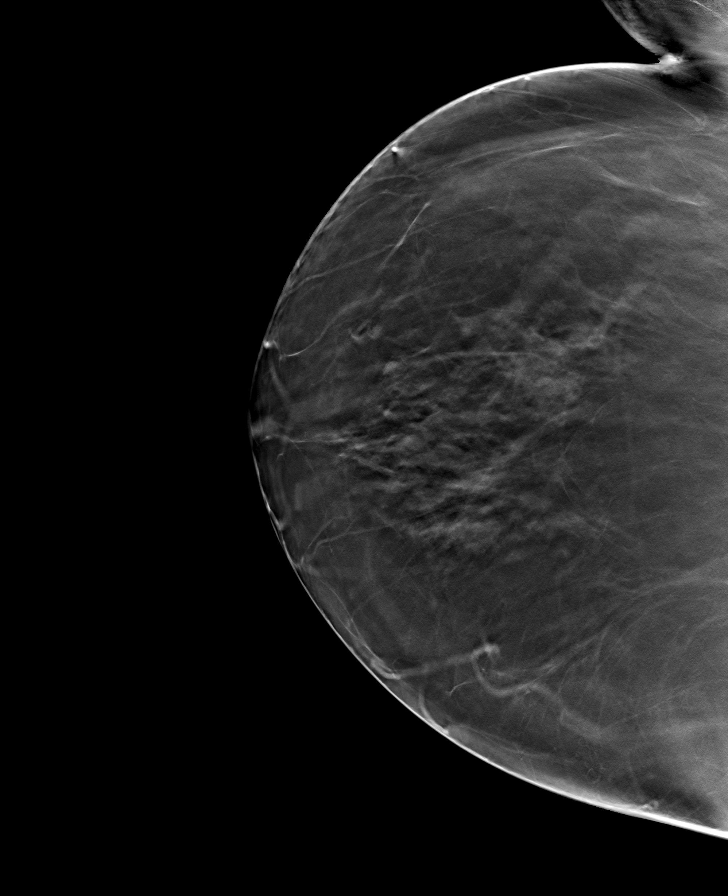

[R MLO tomo · tomo slice 49/97.0]
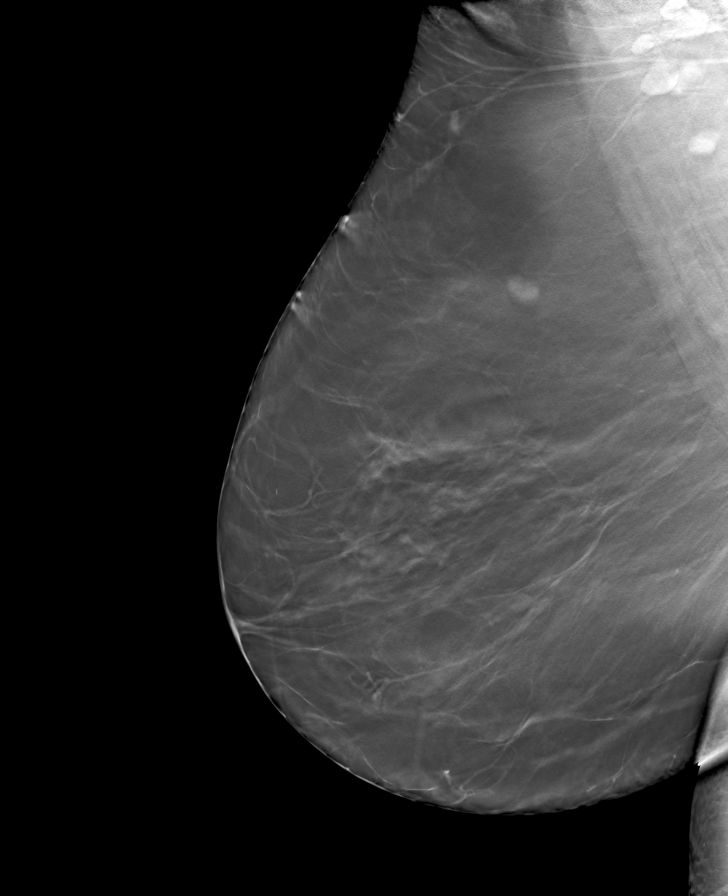

[R CC tomo · tomo slice 46/91.0]
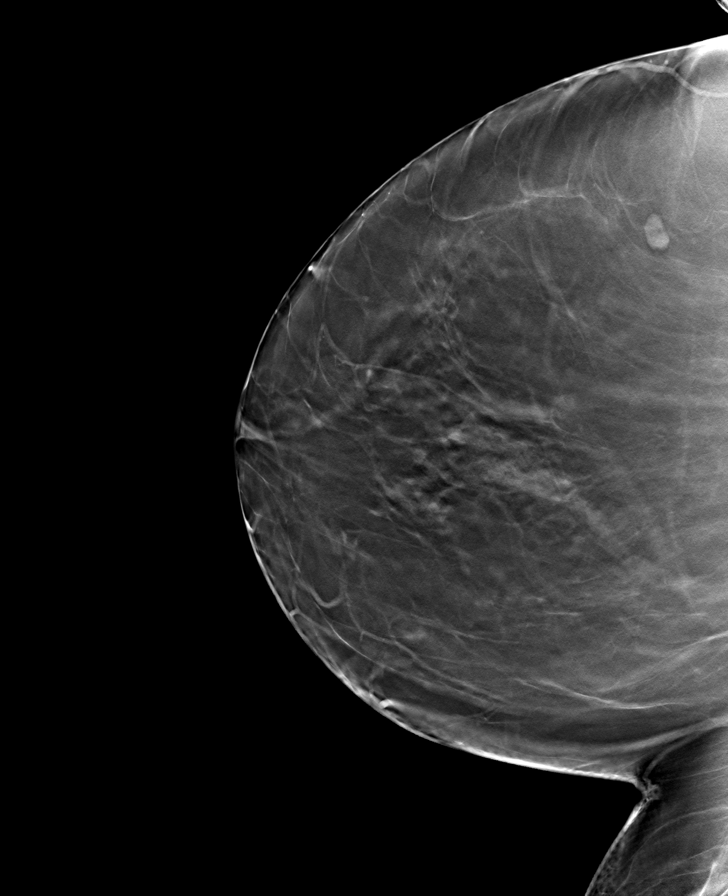

[L MLO tomo · tomo slice 48/95.0]
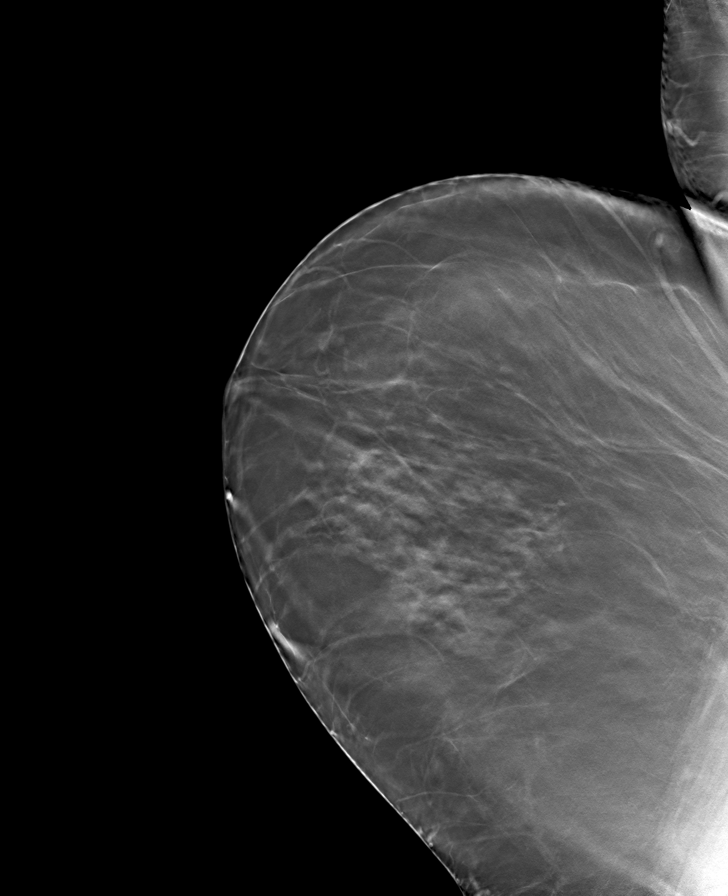

[8 of 24 positions shown; findings below may reference images not displayed]

ACR Breast Density Category b: There are scattered areas of
fibroglandular density.
FINDINGS: Benign intramammary lymph node in the outer right breast. No
suspicious mass, architectural distortion, or suspicious
microcalcification is identified in either breast to suggest
malignancy. Portion of the left axilla was not completely included
on the MLO image, so the left axilla is imaged with ultrasound, see
below.

Mammographic images were processed with CAD.

On physical exam, no suspicious mass is palpated in the upper
central/upper inner left breast today.

Targeted ultrasound is performed, showing normal fibroglandular
tissue and fat lobules in the 11 o'clock position of the left breast
in the region of patient concern. No solid or cystic mass or
abnormal shadowing is identified in the left breast. Ultrasound of
the left axilla is negative for lymphadenopathy.
IMPRESSION: No evidence of malignancy in either breast.

RECOMMENDATION:
Screening mammogram in one year.(Code:JM-7-XYN)

I have discussed the findings and recommendations with the patient.
Results were also provided in writing at the conclusion of the
visit. If applicable, a reminder letter will be sent to the patient
regarding the next appointment.

BI-RADS CATEGORY  1: Negative.

## 2020-11-14 ENCOUNTER — Other Ambulatory Visit: Payer: Self-pay | Admitting: Cardiovascular Disease

## 2020-11-26 NOTE — Progress Notes (Signed)
Cardiology Office Note  Date:  11/27/2020   ID:  Vanessa Snow, DOB 1961-11-12, MRN [id number]  PCP:  Arne Cleveland, MD   Chief Complaint  Patient presents with  . Follow-up    1 Year follow up and medications verbally reviewed with patient.     HPI:  Vanessa Snow is a 59 year old woman with history of  Coronary artery disease,  STEMI, stent 2 to her RCA and left circumflex, 2016 chronic back pain,  smoking, stopped 10/2016  hyperlipidemia,  hypertension  who presents for routine follow-up of her coronary artery disease  In follow-up today, reports having low energy Chronic poor sleep, etiology multifactorial polyuria/nocturia, good volume Arthritis Waking up at least 3 times a night to go urinate Up at 4 am, work at [employer] to bed 8 pm  Seen in urgent care couple days ago for cramping in her back Reports having chronic back pain Has Voltaren cream, NSAIDs, Flexeril Daughter gives her back massage  No regular exercise program Takes care of her mother who lives with her Daughter helps  No significant chest pain concerning for angina  No significant myalgias from Lipitor Zetia  Works in Set designer, 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 she had DES to her RCA.  DES to her left circumflex. She has residual 30 and 40% proximal and mid LAD disease. Peak troponin of 6.  LV gram showing no wall motion abnormality.    PMH:   has a past medical history of CAD, multiple vessel, STEMI-DES-RCA 01/07/15, pLCX DES-01/08/15 (01/09/2015), Hyperlipidemia LDL goal <70 (01/09/2015),  Hypertension, and Myocardial infarction (HCC).  PSH:    Past Surgical History:  Procedure Laterality Date  . CARDIAC CATHETERIZATION  01/08/2015   stent to LCX, synergy DES  . CORONARY ANGIOPLASTY     stent to LCX, synergy DES  . CORONARY STENT PLACEMENT  01/07/2015   rca  DES, STEMI  . HYSTEROSCOPY WITH D & C N/A 07/01/2016   Procedure: DILATATION AND CURETTAGE /HYSTEROSCOPY;  Surgeon: Nadara Mustard, MD;  Location: [facility] ORS;  Service: Gynecology;  Laterality: N/A;  . LEFT HEART CATHETERIZATION WITH CORONARY ANGIOGRAM N/A 01/07/2015   Procedure: LEFT HEART CATHETERIZATION WITH CORONARY ANGIOGRAM;  Surgeon: Lesleigh Noe, MD;  Location: [facility] CATH LAB;  Service: Cardiovascular;  Laterality: N/A;  . PERCUTANEOUS CORONARY STENT INTERVENTION (PCI-S) N/A 01/08/2015   Procedure: PERCUTANEOUS CORONARY STENT INTERVENTION (PCI-S);  Surgeon: Peter M Swaziland, MD;  Location: [facility] CATH LAB;  Service: Cardiovascular;  Laterality: N/A;  . TUBAL LIGATION      Current Outpatient Medications  Medication Sig Dispense Refill  . acetaminophen (TYLENOL) 325 MG tablet Take 2 tablets (650 mg total) by mouth every 4 (four) hours as needed for headache or mild pain.    Marland Kitchen aspirin 81 MG chewable tablet Chew 1 tablet (81 mg total) by mouth daily.    Marland Kitchen atorvastatin (LIPITOR) 40 MG tablet TAKE 1 TABLET BY MOUTH ONCE DAILY AT 6PM 90 tablet 0  . Cyanocobalamin (VITAMIN B-12) 2500 MCG SUBL Place 2,500 mcg under the tongue daily.     Marland Kitchen  cyclobenzaprine (FLEXERIL) 10 MG tablet Take by mouth.    . EPINEPHrine 0.3 mg/0.3 mL IJ SOAJ injection INJECT 0.3ML INTO THE MUSCLE ONCE FOR 1 DOSE    . ezetimibe (ZETIA) 10 MG tablet TAKE 1 TABLET BY MOUTH EVERY DAY 90 tablet 0  . ferrous sulfate 325 (65 FE) MG tablet Take 325 mg by mouth daily with breakfast.    . ibuprofen (ADVIL) 200 MG tablet Take by mouth.    . losartan-hydrochlorothiazide (HYZAAR) 100-25 MG tablet TAKE 1 TABLET BY MOUTH EVERY DAY 90 tablet 1  . metoprolol tartrate  (LOPRESSOR) 25 MG tablet TAKE 1 TABLET BY MOUTH TWICE A DAY 180 tablet 0  . Multiple Vitamin (MULTIVITAMIN WITH MINERALS) TABS tablet Take 1 tablet by mouth daily.    . nitroGLYCERIN (NITROSTAT) 0.4 MG SL tablet Place 1 tablet (0.4 mg total) under the tongue every 5 (five) minutes as needed for chest pain. 25 tablet 4  . Potassium 99 MG TABS Take by mouth daily.    . Vitamins/Minerals TABS Take by mouth.     No current facility-administered medications for this visit.     Allergies:   Ace inhibitors, Sulfa antibiotics, Other, and Shellfish allergy   Social History:  The patient  reports that she quit smoking about 5 years ago. Her smoking use included cigarettes. She has a 15.00 pack-year smoking history. She has never used smokeless tobacco. She reports that she does not drink alcohol and does not use drugs.   Family History:   family history is not on file.    Review of Systems: Review of Systems  Constitutional: Negative.        Weight gain  HENT: Negative.   Respiratory: Negative.  Negative for shortness of breath.   Cardiovascular: Negative.  Negative for chest pain.  Gastrointestinal: Negative.   Musculoskeletal: Negative.   Neurological: Negative.   Psychiatric/Behavioral: Negative.   All other systems reviewed and are negative.   PHYSICAL EXAM: VS:  BP 120/88 (BP Location: Left Arm, Patient Position: Sitting, Cuff Size: Large)   Pulse 76   Ht 5' 2.5" (1.588 m)   Wt 239 lb (108.4 kg)   SpO2 95%   BMI 43.02 kg/m  , BMI Body mass index is 43.02 kg/m.  Constitutional:  oriented to person, place, and time. No distress.  HENT:  Head: Grossly normal Eyes:  no discharge. No scleral icterus.  Neck: No JVD, no carotid bruits  Cardiovascular: Regular rate and rhythm, no murmurs appreciated Pulmonary/Chest: Clear to auscultation bilaterally, no wheezes or rails Abdominal: Soft.  no distension.  no tenderness.  Musculoskeletal: Normal range of motion Neurological:  normal  muscle tone. Coordination normal. No atrophy Skin: Skin warm and dry Psychiatric: normal affect, pleasant  Recent Labs: No results found for requested labs within last 8760 hours.    Lipid Panel Lab Results  Component Value Date   CHOL 237 (H) 01/04/2018   HDL 63 01/04/2018   LDLCALC 150 (H) 01/04/2018   TRIG 120 01/04/2018  Was not on Lipitor at the time of this lab work    [employer] Readings from Last 3 Encounters:  11/27/20 239 lb (108.4 kg)  11/23/19 229 lb (103.9 kg)  06/26/19 189 lb (85.7 kg)       ASSESSMENT AND PLAN:  CAD, multiple vessel, STEMI-DES-RCA 01/07/15, pLCX DES-01/08/15  Currently with no symptoms of angina. No further workup at this time. Continue current medication regimen. Cholesterol ordered  Essential hypertension Blood pressure is well controlled on today's  visit. No changes made to the medications.  Hyperlipidemia  Weight has been trending higher over the past year Weight 189 now 239 Repeat lipids pending  Type 2 diabetes mellitus with other circulatory complication, without long-term current use of insulin (HCC) Lifestyle modification recommended Dramatic weight gain over the past year  Tobacco abuse Stop smoking 2017 with Chantix Non-smoker again confirmed  Fatigue Poor sleep, nocturia, arthritis  Total encounter time more than 25 minutes Greater than 50% was spent in counseling and coordination of care with the patient   Orders Placed This Encounter  Procedures  . CBC  . Comprehensive metabolic panel  . TSH  . EKG 12-Lead    I, Diona Browner am acting as a scribe for Julien Nordmann, M.D., Ph.D.  I have reviewed the above documentation for accuracy and completeness, and I agree with the above.   Signed, Dossie Arbour, M.D., Ph.D. 11/27/2020  [facility] [place], [place] [phone]

## 2020-11-27 ENCOUNTER — Other Ambulatory Visit: Payer: Self-pay

## 2020-11-27 ENCOUNTER — Ambulatory Visit: Payer: BC Managed Care – PPO | Admitting: Cardiovascular Disease

## 2020-11-27 ENCOUNTER — Encounter: Payer: Self-pay | Admitting: Cardiovascular Disease

## 2020-11-27 VITALS — BP 120/88 | HR 76 | Ht 62.5 in | Wt 239.0 lb

## 2020-11-27 DIAGNOSIS — I25118 Atherosclerotic heart disease of native coronary artery with other forms of angina pectoris: Secondary | ICD-10-CM

## 2020-11-27 DIAGNOSIS — E785 Hyperlipidemia, unspecified: Secondary | ICD-10-CM

## 2020-11-27 DIAGNOSIS — I10 Essential (primary) hypertension: Secondary | ICD-10-CM

## 2020-11-27 DIAGNOSIS — E1159 Type 2 diabetes mellitus with other circulatory complications: Secondary | ICD-10-CM | POA: Diagnosis not present

## 2020-11-27 DIAGNOSIS — Z720 Tobacco use: Secondary | ICD-10-CM

## 2020-11-27 NOTE — Patient Instructions (Addendum)
Medication Instructions:  No changes  If you need a refill on your cardiac medications before your next appointment, please call your pharmacy.    Lab work: Labs through [employer]: order have been printed to take with you to have them drawn  CBC, CMP, lipids, TSH   If you have labs (blood work) drawn today and your tests are completely normal, you will receive your results only by: Marland Kitchen MyChart Message (if you have MyChart) OR . A paper copy in the mail If you have any lab test that is abnormal or we need to change your treatment, we will call you to review the results.   Testing/Procedures: No new testing needed   Follow-Up: At [facility], you and your health needs are our priority.  As part of our continuing mission to provide you with exceptional heart care, we have created designated Provider Care Teams.  These Care Teams include your primary Cardiologist (physician) and Advanced Practice Providers (APPs -  Physician Assistants and Nurse Practitioners) who all work together to provide you with the care you need, when you need it.  . You will need a follow up appointment in 12 months  . Providers on your designated Care Team:   . Nicolasa Ducking, NP . Eula Listen, PA-C . Marisue Ivan, PA-C  Any Other Special Instructions Will Be Listed Below (If Applicable).  COVID-19 Vaccine Information can be found at: PodExchange.nl For questions related to vaccine distribution or appointments, please email [email or web address] or call [phone].

## 2020-12-03 ENCOUNTER — Other Ambulatory Visit: Payer: Self-pay | Admitting: Cardiovascular Disease

## 2020-12-04 LAB — COMPREHENSIVE METABOLIC PANEL
ALT: 26 IU/L (ref 0–32)
AST: 26 IU/L (ref 0–40)
Albumin/Globulin Ratio: 1.5 (ref 1.2–2.2)
Albumin: 4.3 g/dL (ref 3.8–4.9)
Alkaline Phosphatase: 135 IU/L — ABNORMAL HIGH (ref 44–121)
BUN/Creatinine Ratio: 18 (ref 9–23)
BUN: 16 mg/dL (ref 6–24)
Bilirubin Total: 0.7 mg/dL (ref 0.0–1.2)
CO2: 25 mmol/L (ref 20–29)
Calcium: 8.9 mg/dL (ref 8.7–10.2)
Chloride: 101 mmol/L (ref 96–106)
Creatinine, Ser: 0.87 mg/dL (ref 0.57–1.00)
GFR calc Af Amer: 84 mL/min/{1.73_m2} (ref >59)
GFR calc non Af Amer: 73 mL/min/{1.73_m2} (ref >59)
Globulin, Total: 2.8 g/dL (ref 1.5–4.5)
Glucose: 98 mg/dL (ref 65–99)
Potassium: 4.1 mmol/L (ref 3.5–5.2)
Sodium: 140 mmol/L (ref 134–144)
Total Protein: 7.1 g/dL (ref 6.0–8.5)

## 2020-12-04 LAB — CBC
Hematocrit: 45.2 % (ref 34.0–46.6)
Hemoglobin: 14.8 g/dL (ref 11.1–15.9)
MCH: 27.7 pg (ref 26.6–33.0)
MCHC: 32.7 g/dL (ref 31.5–35.7)
MCV: 85 fL (ref 79–97)
Platelets: 238 10*3/uL (ref 150–450)
RBC: 5.35 x10E6/uL — ABNORMAL HIGH (ref 3.77–5.28)
RDW: 12.7 % (ref 11.7–15.4)
WBC: 7.7 10*3/uL (ref 3.4–10.8)

## 2020-12-04 LAB — TSH: TSH: 2.97 u[IU]/mL (ref 0.450–4.500)

## 2020-12-21 ENCOUNTER — Other Ambulatory Visit: Payer: Self-pay | Admitting: Cardiovascular Disease

## 2021-01-12 ENCOUNTER — Other Ambulatory Visit: Payer: Self-pay | Admitting: Cardiovascular Disease

## 2021-01-14 ENCOUNTER — Other Ambulatory Visit: Payer: Self-pay | Admitting: Cardiovascular Disease

## 2021-01-14 NOTE — Telephone Encounter (Signed)
Rx request sent to pharmacy.  

## 2021-02-08 ENCOUNTER — Other Ambulatory Visit: Payer: Self-pay | Admitting: Cardiovascular Disease

## 2021-04-23 ENCOUNTER — Ambulatory Visit
Admission: RE | Admit: 2021-04-23 | Discharge: 2021-04-23 | Disposition: A | Discharge status: Home / Self Care | Payer: Worker's Compensation | Attending: Physician Assistant | Admitting: Physician Assistant

## 2021-04-23 ENCOUNTER — Other Ambulatory Visit: Payer: Self-pay | Admitting: Physician Assistant

## 2021-04-23 ENCOUNTER — Ambulatory Visit
Admission: RE | Admit: 2021-04-23 | Discharge: 2021-04-23 | Disposition: A | Discharge status: Home / Self Care | Payer: Worker's Compensation | Source: Ambulatory Visit | Attending: Physician Assistant | Admitting: Physician Assistant

## 2021-04-23 DIAGNOSIS — M25561 Pain in right knee: Secondary | ICD-10-CM | POA: Diagnosis not present

## 2021-04-23 DIAGNOSIS — W19XXXA Unspecified fall, initial encounter: Secondary | ICD-10-CM

## 2021-04-23 DIAGNOSIS — S8981XA Other specified injuries of right lower leg, initial encounter: Secondary | ICD-10-CM | POA: Insufficient documentation

## 2021-04-23 DIAGNOSIS — M1711 Unilateral primary osteoarthritis, right knee: Secondary | ICD-10-CM | POA: Insufficient documentation

## 2021-07-14 ENCOUNTER — Other Ambulatory Visit: Payer: Self-pay | Admitting: Cardiovascular Disease

## 2021-07-23 ENCOUNTER — Other Ambulatory Visit: Payer: Self-pay | Admitting: Family Medicine

## 2021-08-09 ENCOUNTER — Other Ambulatory Visit: Payer: Self-pay | Admitting: Cardiovascular Disease

## 2021-11-18 NOTE — Progress Notes (Signed)
Cardiology Office Note  Date:  11/19/2021   ID:  Vanessa Snow, DOB 30-Jun-1961, MRN [id number]  PCP:  Arne Cleveland, MD   Chief Complaint  Patient presents with   12 month follow up    " Doing well." Medications reviewed by the patient verbally.     HPI:  Vanessa Snow is a 60 year-old woman with history of  Coronary artery disease,  STEMI, stent 2 to her RCA and left circumflex, 2016 chronic back pain,  smoking, stopped 10/2016  hyperlipidemia,  hypertension  who presents for routine follow-up of her coronary artery disease  In follow-up today,  No specific complaints Prior history poor energy, poor sleep polyuria/nocturia,  Has back discomfort, arthritis  No regular exercise program Continues to work full-time, works in Insurance underwriter care of her mother, who lives with her No chest pain concerning for angina  Tolerating Lipitor, Zetia  Prior lab work New lab work ordered today HBA1c 6.7, slow trend upwards from 6.0 TOTAL CHOL 130, LDL 51   EKG personally reviewed by myself on todays visit Normal sinus rhythm  no significant ST or T wave changes  Other past medical history reviewed  admission to the hospital for STEMI January 2016, subtotally occluded RCA in the mid to distal region also with left circumflex disease. she had DES to her RCA.  DES to her left circumflex. She has residual 30 and 40% proximal and mid LAD disease. Peak troponin of 6.  LV gram showing no wall motion abnormality.    PMH:   has a past medical history of CAD, multiple vessel, STEMI-DES-RCA 01/07/15, pLCX DES-01/08/15 (01/09/2015), Hyperlipidemia LDL goal <70 (01/09/2015), Hypertension, and Myocardial infarction (HCC).  PSH:    Past Surgical History:  Procedure Laterality Date   CARDIAC CATHETERIZATION  01/08/2015   stent to LCX, synergy DES   CORONARY ANGIOPLASTY     stent to LCX, synergy DES   CORONARY STENT PLACEMENT  01/07/2015   rca  DES, STEMI    HYSTEROSCOPY WITH D & C N/A 07/01/2016   Procedure: DILATATION AND CURETTAGE /HYSTEROSCOPY;  Surgeon: Nadara Mustard, MD;  Location: [facility] ORS;  Service: Gynecology;  Laterality: N/A;   LEFT HEART CATHETERIZATION WITH CORONARY ANGIOGRAM N/A 01/07/2015   Procedure: LEFT HEART CATHETERIZATION WITH CORONARY ANGIOGRAM;  Surgeon: Lesleigh Noe, MD;  Location: [facility] CATH LAB;  Service: Cardiovascular;  Laterality: N/A;   PERCUTANEOUS CORONARY STENT INTERVENTION (PCI-S) N/A 01/08/2015   Procedure: PERCUTANEOUS CORONARY STENT INTERVENTION (PCI-S);  Surgeon: Peter M Swaziland, MD;  Location: [facility] CATH LAB;  Service: Cardiovascular;  Laterality: N/A;   TUBAL LIGATION      Current Outpatient Medications  Medication Sig Dispense Refill   acetaminophen (TYLENOL) 325 MG tablet Take 2 tablets (650 mg total) by mouth every 4 (four) hours as needed for headache or mild pain.     aspirin 81 MG chewable tablet Chew 1 tablet (81 mg total) by mouth daily.     atorvastatin (LIPITOR) 40 MG tablet Take 1 tablet (40 mg total) by mouth daily. Please make your yearly appt in December for continued refills with Dr. Mariah Milling 90 tablet 2   Cyanocobalamin (VITAMIN B-12) 2500 MCG SUBL Place 2,500 mcg under the tongue daily.      EPINEPHrine 0.3 mg/0.3 mL IJ SOAJ injection INJECT 0.3ML INTO THE MUSCLE ONCE FOR 1 DOSE     ezetimibe (ZETIA) 10 MG tablet TAKE 1 TABLET BY MOUTH EVERY DAY 90 tablet 3   losartan-hydrochlorothiazide (HYZAAR)  100-25 MG tablet TAKE 1 TABLET BY MOUTH EVERY DAY 90 tablet 3   metoprolol tartrate (LOPRESSOR) 25 MG tablet Take 1 tablet (25 mg total) by mouth 2 (two) times daily. Please make your yearly appt in December for continued refills with Dr. Mariah Milling 180 tablet 2   Multiple Vitamin (MULTIVITAMIN WITH MINERALS) TABS tablet Take 1 tablet by mouth daily.     nitroGLYCERIN (NITROSTAT) 0.4 MG SL tablet Place 1 tablet (0.4 mg total) under the tongue every 5 (five) minutes as needed for chest pain. 25 tablet 4    Potassium 99 MG TABS Take by mouth daily.     Vitamins/Minerals TABS Take by mouth.     ferrous sulfate 325 (65 FE) MG tablet Take 325 mg by mouth daily with breakfast. (Patient not taking: Reported on 11/19/2021)     ibuprofen (ADVIL) 200 MG tablet Take by mouth. (Patient not taking: Reported on 11/19/2021)     No current facility-administered medications for this visit.     Allergies:   Ace inhibitors, Sulfa antibiotics, Other, and Shellfish allergy   Social History:  The patient  reports that she quit smoking about 6 years ago. Her smoking use included cigarettes. She has a 15.00 pack-year smoking history. She has never used smokeless tobacco. She reports that she does not drink alcohol and does not use drugs.   Family History:   family history is not on file.    Review of Systems: Review of Systems  Constitutional: Negative.        Weight gain  HENT: Negative.    Respiratory: Negative.  Negative for shortness of breath.   Cardiovascular: Negative.  Negative for chest pain.  Gastrointestinal: Negative.   Musculoskeletal: Negative.   Neurological: Negative.   Psychiatric/Behavioral: Negative.    All other systems reviewed and are negative.  PHYSICAL EXAM: VS:  BP 110/74 (BP Location: Left Arm, Patient Position: Sitting, Cuff Size: Normal)   Pulse 62   Ht 5' 2.5" (1.588 m)   Wt 239 lb (108.4 kg)   SpO2 98%   BMI 43.02 kg/m  , BMI Body mass index is 43.02 kg/m.  Constitutional:  oriented to person, place, and time. No distress.  HENT:  Head: Grossly normal Eyes:  no discharge. No scleral icterus.  Neck: No JVD, no carotid bruits  Cardiovascular: Regular rate and rhythm, no murmurs appreciated Pulmonary/Chest: Clear to auscultation bilaterally, no wheezes or rails Abdominal: Soft.  no distension.  no tenderness.  Musculoskeletal: Normal range of motion Neurological:  normal muscle tone. Coordination normal. No atrophy Skin: Skin warm and dry Psychiatric: normal affect,  pleasant  Recent Labs: 12/03/2020: ALT 26; BUN 16; Creatinine, Ser 0.87; Hemoglobin 14.8; Platelets 238; Potassium 4.1; Sodium 140; TSH 2.970    Lipid Panel Lab Results  Component Value Date   CHOL 237 (H) 01/04/2018   HDL 63 01/04/2018   LDLCALC 150 (H) 01/04/2018   TRIG 120 01/04/2018  Was not on Lipitor at the time of this lab work    [employer] Readings from Last 3 Encounters:  11/19/21 239 lb (108.4 kg)  11/27/20 239 lb (108.4 kg)  11/23/19 229 lb (103.9 kg)      ASSESSMENT AND PLAN:  CAD, multiple vessel, STEMI-DES-RCA 01/07/15, pLCX DES-01/08/15  Currently with no symptoms of angina. No further workup at this time. Continue current medication regimen.  Essential hypertension Blood pressure is well controlled on today's visit. No changes made to the medications. BMP stable  Hyperlipidemia  Cholesterol is at goal  on the current lipid regimen. No changes to the medications were made.  Type 2 diabetes mellitus with other circulatory complication, without long-term current use of insulin (HCC) Weight gain, recommended walking program, dietary changes  Tobacco abuse Stop smoking 2017 with Chantix Confirm she is not smoking  Total encounter time more than 25 minutes Greater than 50% was spent in counseling and coordination of care with the patient   No orders of the defined types were placed in this encounter.   I, Diona Browner am acting as a scribe for Julien Nordmann, M.D., Ph.D.  I have reviewed the above documentation for accuracy and completeness, and I agree with the above.   Signed, Dossie Arbour, M.D., Ph.D. 11/19/2021  [facility] [place], [place] [phone]

## 2021-11-19 ENCOUNTER — Encounter: Payer: Self-pay | Admitting: Cardiovascular Disease

## 2021-11-19 ENCOUNTER — Ambulatory Visit (INDEPENDENT_AMBULATORY_CARE_PROVIDER_SITE_OTHER): Payer: BC Managed Care – PPO | Admitting: Cardiovascular Disease

## 2021-11-19 ENCOUNTER — Other Ambulatory Visit: Payer: Self-pay

## 2021-11-19 VITALS — BP 110/74 | HR 62 | Ht 62.5 in | Wt 239.0 lb

## 2021-11-19 DIAGNOSIS — I25118 Atherosclerotic heart disease of native coronary artery with other forms of angina pectoris: Secondary | ICD-10-CM | POA: Diagnosis not present

## 2021-11-19 DIAGNOSIS — E1159 Type 2 diabetes mellitus with other circulatory complications: Secondary | ICD-10-CM

## 2021-11-19 DIAGNOSIS — Z720 Tobacco use: Secondary | ICD-10-CM

## 2021-11-19 DIAGNOSIS — E785 Hyperlipidemia, unspecified: Secondary | ICD-10-CM

## 2021-11-19 DIAGNOSIS — I10 Essential (primary) hypertension: Secondary | ICD-10-CM

## 2021-11-19 MED ORDER — NITROGLYCERIN 0.4 MG SL SUBL: 0.4000 mg | SUBLINGUAL_TABLET | SUBLINGUAL | 1 refills | Status: DC | PRN

## 2021-11-19 MED ORDER — METOPROLOL TARTRATE 25 MG PO TABS: 25.0000 mg | ORAL_TABLET | Freq: Two times a day (BID) | ORAL | 3 refills | Status: DC

## 2021-11-19 MED ORDER — LOSARTAN POTASSIUM-HCTZ 100-25 MG PO TABS: 1.0000 | ORAL_TABLET | Freq: Every day | ORAL | 3 refills | Status: DC

## 2021-11-19 MED ORDER — ATORVASTATIN CALCIUM 40 MG PO TABS: 40.0000 mg | ORAL_TABLET | Freq: Every day | ORAL | 3 refills | Status: DC

## 2021-11-19 MED ORDER — EZETIMIBE 10 MG PO TABS: 10.0000 mg | ORAL_TABLET | Freq: Every day | ORAL | 3 refills | Status: DC

## 2021-11-19 NOTE — Patient Instructions (Addendum)
Medication Instructions:  No changes  If you need a refill on your cardiac medications before your next appointment, please call your pharmacy.   Lab work: Lipid panel, A1C, CMP  Testing/Procedures: No new testing needed  Follow-Up: At [facility], you and your health needs are our priority.  As part of our continuing mission to provide you with exceptional heart care, we have created designated Provider Care Teams.  These Care Teams include your primary Cardiologist (physician) and Advanced Practice Providers (APPs -  Physician Assistants and Nurse Practitioners) who all work together to provide you with the care you need, when you need it.  You will need a follow up appointment in 12 months  Providers on your designated Care Team:   Nicolasa Ducking, NP Eula Listen, PA-C Cadence Fransico Michael, [place]  COVID-19 Vaccine Information can be found at: [email or web address] For questions related to vaccine distribution or appointments, please email [email or web address] or call [phone].

## 2021-11-20 LAB — LIPID PANEL
Chol/HDL Ratio: 2.2 ratio (ref 0.0–4.4)
Cholesterol, Total: 130 mg/dL (ref 100–199)
HDL: 58 mg/dL (ref >39)
LDL Chol Calc (NIH): 51 mg/dL (ref 0–99)
Triglycerides: 118 mg/dL (ref 0–149)
VLDL Cholesterol Cal: 21 mg/dL (ref 5–40)

## 2021-11-20 LAB — HEMOGLOBIN A1C
Est. average glucose Bld gHb Est-mCnc: 146 mg/dL
Hgb A1c MFr Bld: 6.7 % — ABNORMAL HIGH (ref 4.8–5.6)

## 2021-11-20 LAB — COMPREHENSIVE METABOLIC PANEL
ALT: 20 IU/L (ref 0–32)
AST: 22 IU/L (ref 0–40)
Albumin/Globulin Ratio: 1.5 (ref 1.2–2.2)
Albumin: 4.1 g/dL (ref 3.8–4.9)
Alkaline Phosphatase: 142 IU/L — ABNORMAL HIGH (ref 44–121)
BUN/Creatinine Ratio: 16 (ref 12–28)
BUN: 15 mg/dL (ref 8–27)
Bilirubin Total: 1 mg/dL (ref 0.0–1.2)
CO2: 24 mmol/L (ref 20–29)
Calcium: 8.9 mg/dL (ref 8.7–10.3)
Chloride: 102 mmol/L (ref 96–106)
Creatinine, Ser: 0.92 mg/dL (ref 0.57–1.00)
Globulin, Total: 2.8 g/dL (ref 1.5–4.5)
Glucose: 111 mg/dL — ABNORMAL HIGH (ref 70–99)
Potassium: 3.6 mmol/L (ref 3.5–5.2)
Sodium: 140 mmol/L (ref 134–144)
Total Protein: 6.9 g/dL (ref 6.0–8.5)
eGFR: 71 mL/min/{1.73_m2} (ref >59)

## 2022-12-22 NOTE — Progress Notes (Unsigned)
Cardiology Office Note  Date:  12/23/2022   ID:  Vanessa Snow, DOB 07/21/1961, MRN [id number]  PCP:  Tereasa Coop, PA-C   Chief Complaint  Patient presents with   12 month follow up     "Doing well." Medications reviewed by the patient verbally.     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, working second shift On ozempic since April 2023 sporadically, more consistently recent months  Reports that she takes 2 over-the-counter potassium a day Potassium level 3.75-month ago  No regular exercise program, works full-time  Takes care of her mother, who lives with her  Denies significant shortness of breath or chest pain concerning for angina  Tolerating Lipitor, Zetia, Numbers are goal  Lab work reviewed HBA1c 6.0 TOTAL CHOL 136, LDL 53   EKG personally reviewed by myself on todays visit Normal sinus rhythm rate 66 bpm T wave abnormality 3 and aVF, consider inferior ischemia  Other past medical history reviewed  admission to the hospital for STEMI January 2016, subtotally occluded RCA in the mid to distal region also with left circumflex disease. she had DES to her RCA.  DES to her left circumflex. She has residual 30 and 40% proximal and mid LAD disease. Peak troponin of 6.  LV gram showing no wall motion abnormality.    PMH:   has a past medical history of CAD, multiple vessel, STEMI-DES-RCA 01/07/15, pLCX DES-01/08/15 (01/09/2015), Hyperlipidemia LDL goal <70 (01/09/2015), Hypertension, and Myocardial infarction ([place]).  PSH:    Past Surgical History:  Procedure Laterality Date   CARDIAC  CATHETERIZATION  01/08/2015   stent to LCX, synergy DES   CORONARY ANGIOPLASTY     stent to LCX, synergy DES   CORONARY STENT PLACEMENT  01/07/2015   rca  DES, STEMI   HYSTEROSCOPY WITH D & C N/A 07/01/2016   Procedure: DILATATION AND CURETTAGE /HYSTEROSCOPY;  Surgeon: Gae Dry, MD;  Location: [facility] ORS;  Service: Gynecology;  Laterality: N/A;   LEFT HEART CATHETERIZATION WITH CORONARY ANGIOGRAM N/A 01/07/2015   Procedure: LEFT HEART CATHETERIZATION WITH CORONARY ANGIOGRAM;  Surgeon: Sinclair Grooms, MD;  Location: [facility] CATH LAB;  Service: Cardiovascular;  Laterality: N/A;   PERCUTANEOUS CORONARY STENT INTERVENTION (PCI-S) N/A 01/08/2015   Procedure: PERCUTANEOUS CORONARY STENT INTERVENTION (PCI-S);  Surgeon: Peter M Martinique, MD;  Location: [facility] CATH LAB;  Service: Cardiovascular;  Laterality: N/A;   TUBAL LIGATION      Current Outpatient Medications  Medication Sig Dispense Refill   acetaminophen (TYLENOL) 325 MG tablet Take 2 tablets (650 mg total) by mouth every 4 (four) hours as needed for headache or mild pain.     aspirin 81 MG chewable tablet Chew 1 tablet (81 mg total) by mouth daily.     atorvastatin (LIPITOR) 40 MG tablet Take 1 tablet (40 mg total) by mouth daily. Please make your yearly appt in December for continued refills with Dr. Rockey Situ 90 tablet 3   Cyanocobalamin (VITAMIN B-12) 2500 MCG SUBL Place 2,500 mcg under the tongue daily.      ezetimibe (ZETIA) 10 MG tablet Take 1 tablet (10  mg total) by mouth daily. 90 tablet 3   ibuprofen (ADVIL) 200 MG tablet Take by mouth.     losartan-hydrochlorothiazide (HYZAAR) 100-25 MG tablet Take 1 tablet by mouth daily. 90 tablet 3   metoprolol tartrate (LOPRESSOR) 25 MG tablet Take 1 tablet (25 mg total) by mouth 2 (two) times daily. Please make your yearly appt in December for continued refills with Dr. Mariah Milling 180 tablet 3   Multiple Vitamin (MULTIVITAMIN WITH MINERALS) TABS tablet Take 1 tablet by mouth daily.     nitroGLYCERIN  (NITROSTAT) 0.4 MG SL tablet Place 1 tablet (0.4 mg total) under the tongue every 5 (five) minutes as needed for chest pain. 25 tablet 1   Potassium 99 MG TABS Take by mouth daily.     Semaglutide, 1 MG/DOSE, (OZEMPIC, 1 MG/DOSE,) 4 MG/3ML SOPN Inject into the skin.     Vitamins/Minerals TABS Take by mouth.     EPINEPHrine 0.3 mg/0.3 mL IJ SOAJ injection INJECT 0.3ML INTO THE MUSCLE ONCE FOR 1 DOSE (Patient not taking: Reported on 12/23/2022)     ferrous sulfate 325 (65 FE) MG tablet Take 325 mg by mouth daily with breakfast. (Patient not taking: Reported on 11/19/2021)     No current facility-administered medications for this visit.     Allergies:   Ace inhibitors, Sulfa antibiotics, Other, and Shellfish allergy   Social History:  The patient  reports that she quit smoking about 7 years ago. Her smoking use included cigarettes. She has a 15.00 pack-year smoking history. She has never used smokeless tobacco. She reports that she does not drink alcohol and does not use drugs.   Family History:   family history is not on file.    Review of Systems: Review of Systems  Constitutional: Negative.   HENT: Negative.    Respiratory: Negative.  Negative for shortness of breath.   Cardiovascular: Negative.  Negative for chest pain.  Gastrointestinal: Negative.   Musculoskeletal: Negative.   Neurological: Negative.   Psychiatric/Behavioral: Negative.    All other systems reviewed and are negative.   PHYSICAL EXAM: VS:  BP 100/80 (BP Location: Left Arm, Patient Position: Sitting, Cuff Size: Large)   Pulse 66   Ht 5' 2.5" (1.588 m)   Wt 242 lb (109.8 kg)   BMI 43.56 kg/m  , BMI Body mass index is 43.56 kg/m.  Constitutional:  oriented to person, place, and time. No distress.  HENT:  Head: Grossly normal Eyes:  no discharge. No scleral icterus.  Neck: No JVD, no carotid bruits  Cardiovascular: Regular rate and rhythm, no murmurs appreciated Pulmonary/Chest: Clear to auscultation  bilaterally, no wheezes or rails Abdominal: Soft.  no distension.  no tenderness.  Musculoskeletal: Normal range of motion Neurological:  normal muscle tone. Coordination normal. No atrophy Skin: Skin warm and dry Psychiatric: normal affect, pleasant   Recent Labs: No results found for requested labs within last 365 days.    Lipid Panel Lab Results  Component Value Date   CHOL 130 11/19/2021   HDL 58 11/19/2021   LDLCALC 51 11/19/2021   TRIG 118 11/19/2021  Was not on Lipitor at the time of this lab work    [employer] Readings from Last 3 Encounters:  12/23/22 242 lb (109.8 kg)  11/19/21 239 lb (108.4 kg)  11/27/20 239 lb (108.4 kg)      ASSESSMENT AND PLAN:  CAD, multiple vessel, STEMI-DES-RCA 01/07/15, pLCX DES-01/08/15  Currently with no symptoms of angina. No further workup at this time. Continue current  medication regimen. A1c reasonable, cholesterol at goal  Essential hypertension Blood pressure is well controlled on today's visit. No changes made to the medications. BMP stable on HCTZ, potassium low end of normal Recommend she start potassium 20 mill equivalents daily and stop her 2 over-the-counter potassium's Suggest she check pressures at home if this continues to run low we may be able to stop the HCTZ and have her take that as needed  Hyperlipidemia  Cholesterol is at goal on the current lipid regimen. No changes to the medications were made.  Type 2 diabetes mellitus with other circulatory complication, without long-term current use of insulin (HCC) A1c reasonable 6.0 Recommend continued low carbohydrate diet, walking program  Tobacco abuse Stopped smoking 2017 with Chantix   Total encounter time more than 30 minutes Greater than 50% was spent in counseling and coordination of care with the patient   Orders Placed This Encounter  Procedures   EKG 12-Lead     Signed, Dossie Arbour, M.D., Ph.D. 12/23/2022  [facility] [place],  [place] [phone]

## 2022-12-23 ENCOUNTER — Ambulatory Visit: Payer: BC Managed Care – PPO | Attending: Cardiovascular Disease | Admitting: Cardiovascular Disease

## 2022-12-23 ENCOUNTER — Encounter: Payer: Self-pay | Admitting: Cardiovascular Disease

## 2022-12-23 VITALS — BP 100/80 | HR 66 | Ht 62.5 in | Wt 242.0 lb

## 2022-12-23 DIAGNOSIS — E785 Hyperlipidemia, unspecified: Secondary | ICD-10-CM

## 2022-12-23 DIAGNOSIS — E1159 Type 2 diabetes mellitus with other circulatory complications: Secondary | ICD-10-CM

## 2022-12-23 DIAGNOSIS — Z720 Tobacco use: Secondary | ICD-10-CM

## 2022-12-23 DIAGNOSIS — I10 Essential (primary) hypertension: Secondary | ICD-10-CM

## 2022-12-23 DIAGNOSIS — I25118 Atherosclerotic heart disease of native coronary artery with other forms of angina pectoris: Secondary | ICD-10-CM

## 2022-12-23 MED ORDER — ATORVASTATIN CALCIUM 40 MG PO TABS: 40.0000 mg | ORAL_TABLET | Freq: Every day | ORAL | 3 refills | Status: DC

## 2022-12-23 MED ORDER — METOPROLOL TARTRATE 25 MG PO TABS: 25.0000 mg | ORAL_TABLET | Freq: Two times a day (BID) | ORAL | 3 refills | Status: DC

## 2022-12-23 MED ORDER — LOSARTAN POTASSIUM-HCTZ 100-25 MG PO TABS: 1.0000 | ORAL_TABLET | Freq: Every day | ORAL | 3 refills | Status: DC

## 2022-12-23 MED ORDER — POTASSIUM CHLORIDE ER 10 MEQ PO TBCR: 20.0000 meq | EXTENDED_RELEASE_TABLET | Freq: Every day | ORAL | 3 refills | Status: DC

## 2022-12-23 MED ORDER — NITROGLYCERIN 0.4 MG SL SUBL: 0.4000 mg | SUBLINGUAL_TABLET | SUBLINGUAL | 1 refills | Status: AC | PRN

## 2022-12-23 MED ORDER — EZETIMIBE 10 MG PO TABS: 10.0000 mg | ORAL_TABLET | Freq: Every day | ORAL | 3 refills | Status: DC

## 2022-12-23 NOTE — Patient Instructions (Addendum)
Medication Instructions:  Please start potassium 10 meq x2 daily  If you need a refill on your cardiac medications before your next appointment, please call your pharmacy.   Lab work: No new labs needed  Testing/Procedures: No new testing needed  Follow-Up: At [facility], you and your health needs are our priority.  As part of our continuing mission to provide you with exceptional heart care, we have created designated Provider Care Teams.  These Care Teams include your primary Cardiologist (physician) and Advanced Practice Providers (APPs -  Physician Assistants and Nurse Practitioners) who all work together to provide you with the care you need, when you need it.  You will need a follow up appointment in 12 months  Providers on your designated Care Team:   Murray Hodgkins, NP Christell Faith, PA-C Cadence Kathlen Mody, [place]  COVID-19 Vaccine Information can be found at: [email or web address].uk For questions related to vaccine distribution or appointments, please email [email or web address] or call [phone].

## 2023-05-08 IMAGING — CR DG KNEE COMPLETE 4+V*R*
1 series · 4 of 4 positions shown · non-contrast
Comparison: None.

CLINICAL DATA: Fall, landing on knee

EXAM:
RIGHT KNEE - COMPLETE 4+ VIEW

[Series 1: dg knee complete 4 views right · 0.14mm/px · 4 of 4 slices shown]
[im 1/4]
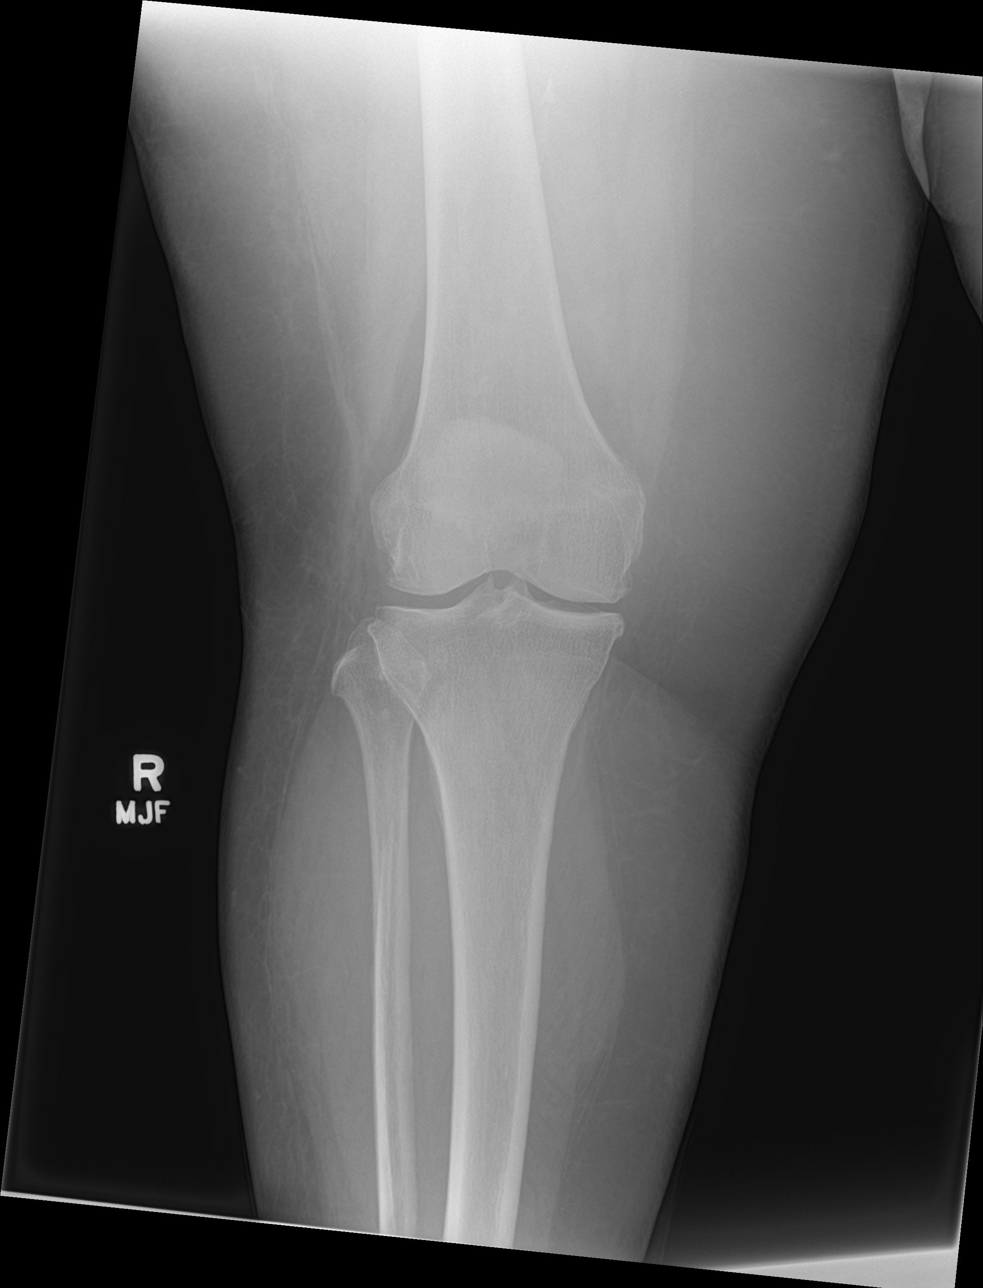
[im 2/4]
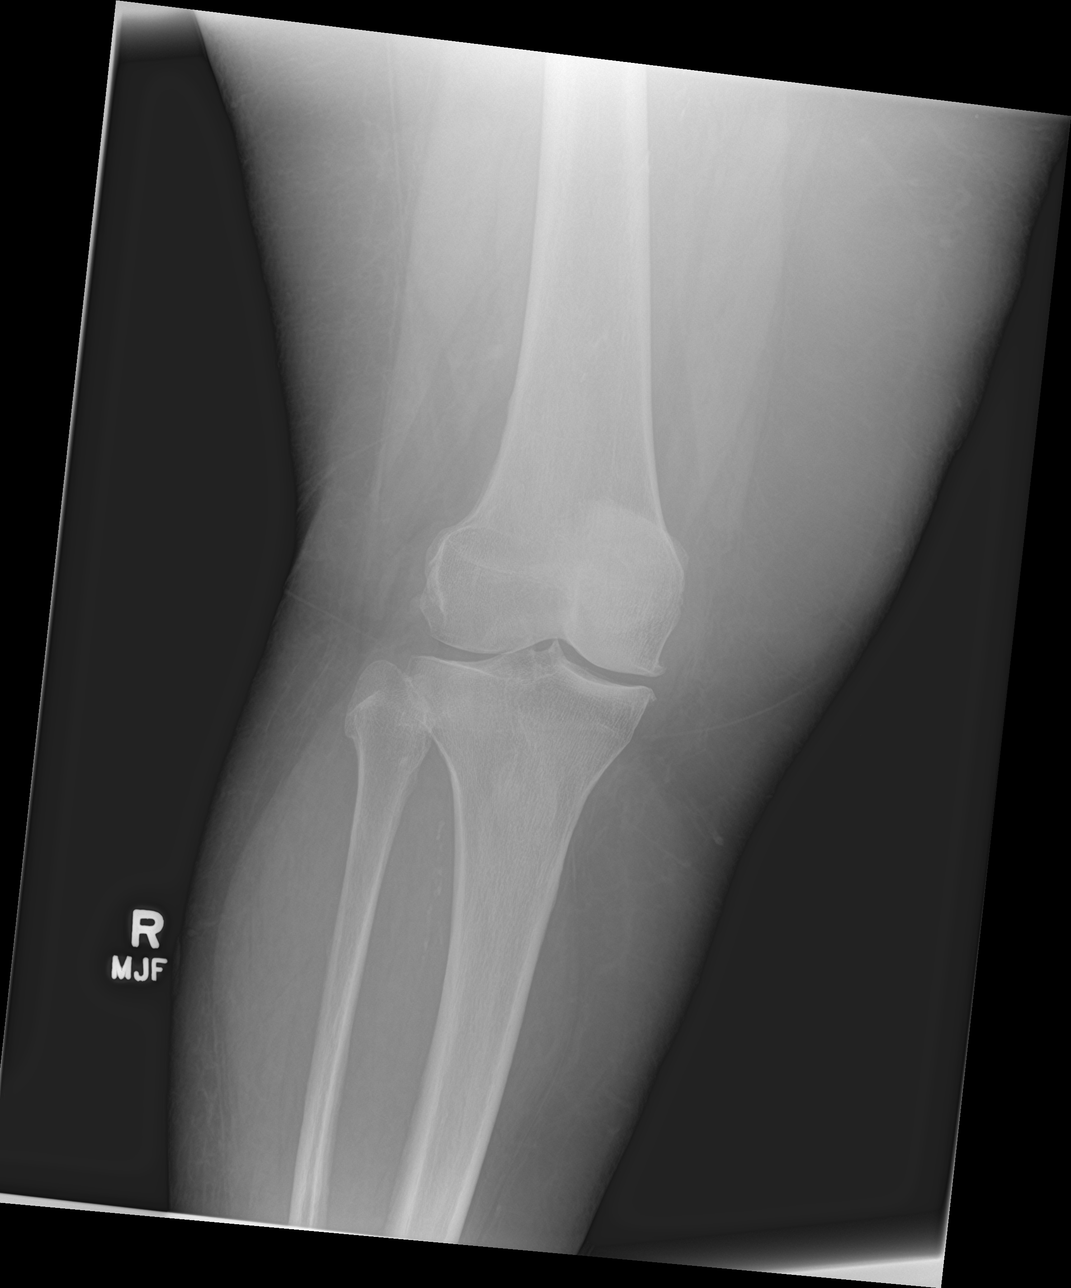
[im 3/4]
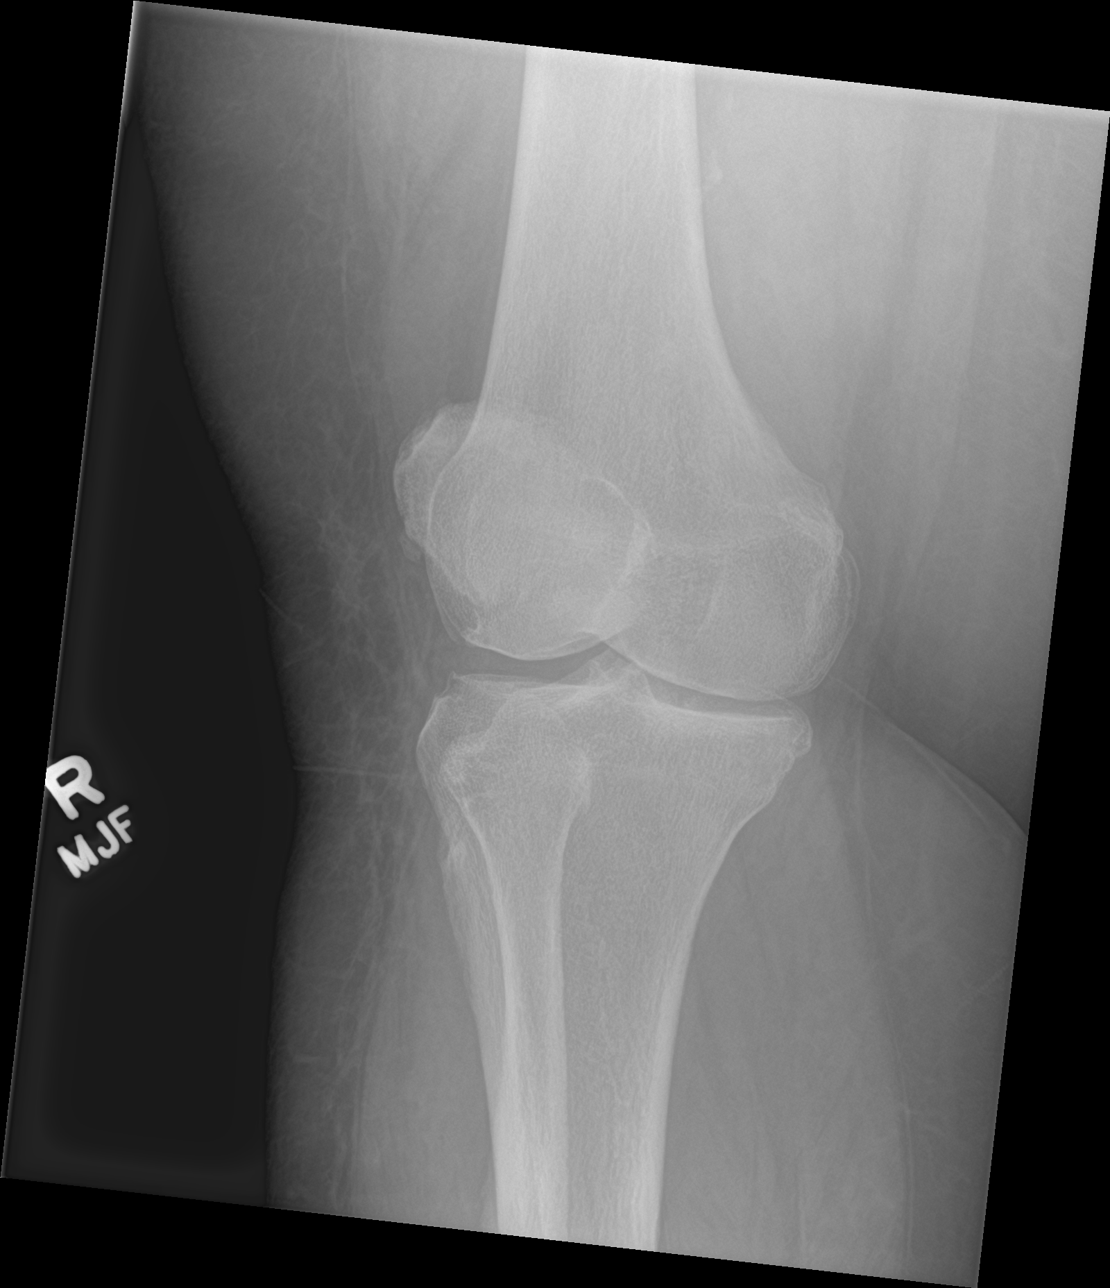
[im 4/4]
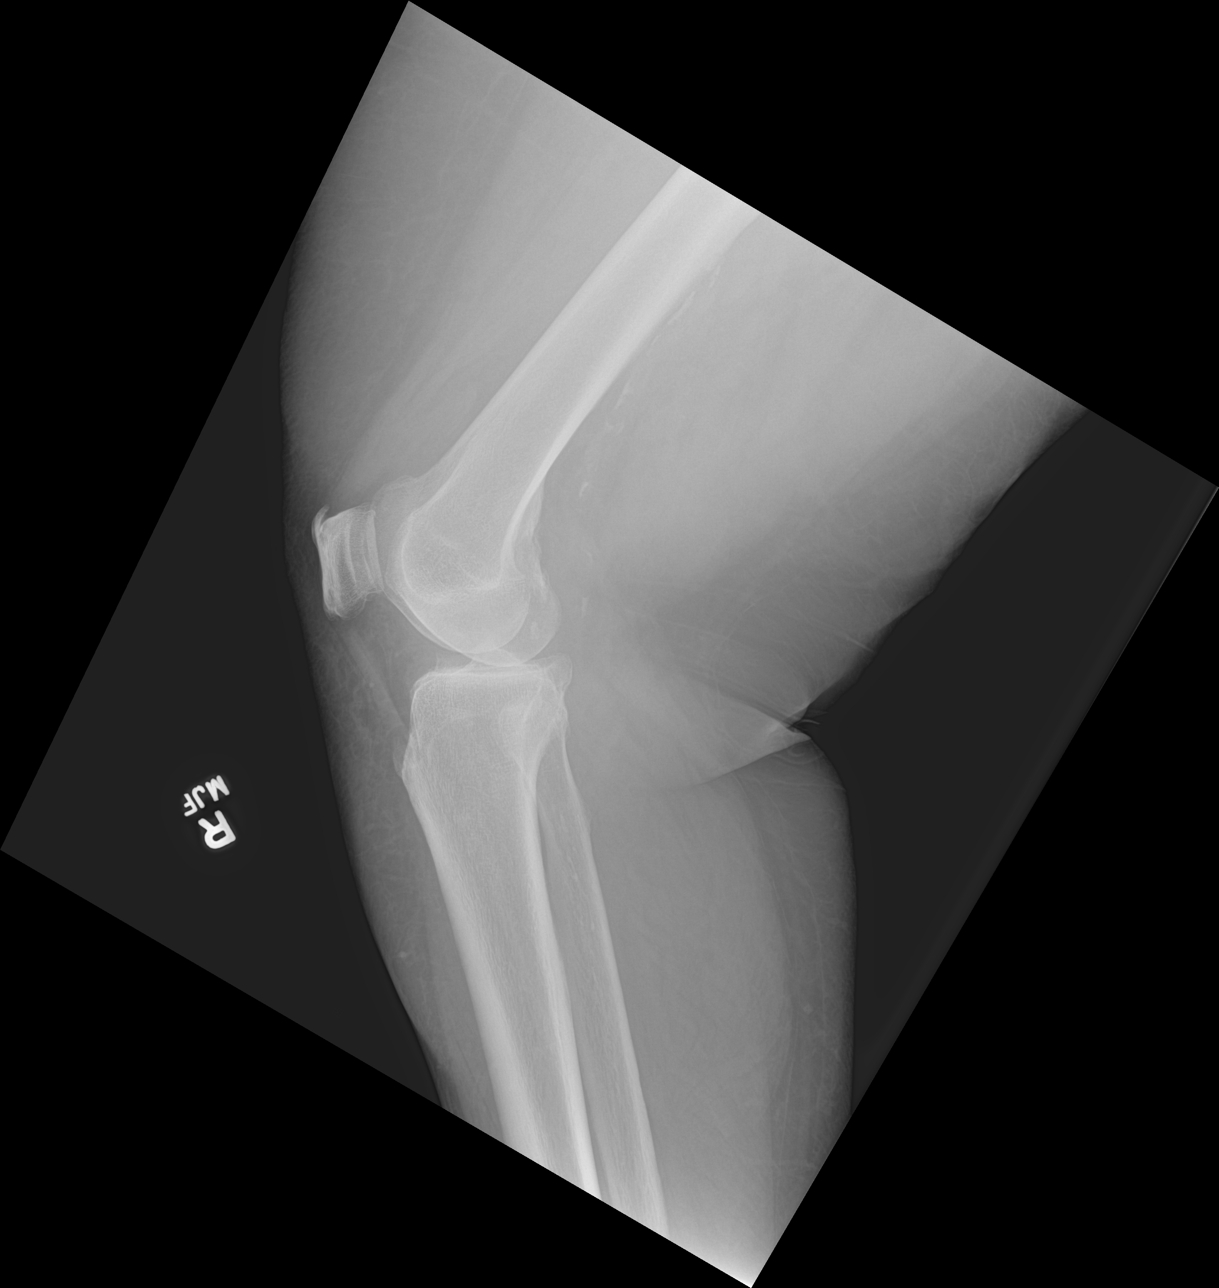

[4 of 4 positions shown; findings below may reference images not displayed]

FINDINGS: Mild degenerative changes throughout the right knee with joint space
narrowing and spurring. Small joint effusion. No acute bony
abnormality. Specifically, no fracture, subluxation, or dislocation.
IMPRESSION: Mild degenerative changes with small joint effusion. No acute bony
abnormality.

## 2023-08-11 ENCOUNTER — Telehealth: Payer: Self-pay | Admitting: Cardiovascular Disease

## 2023-08-11 NOTE — Telephone Encounter (Signed)
Called patient back, she states that she has been having some issues with anxiety and depression- patient recently seen family medicine (note in epic) stating that they would like to have FMLA short-term leave, she was asking if Dr.Gollan could sign for her to have a short term leave, due to these issues. I advised her I would send a message back, however for Dr.Gollan to sign her for short-term it should be something that he sees her for, these issues are more of a primary care area. Patient verbalized understanding, advising she made an appointment for October and would discuss then as well. Will route to MD to make aware.   Thanks!

## 2023-08-11 NOTE — Telephone Encounter (Signed)
Called and left a message for the patient regarding the following from Dr. Mariah Milling.  Would be best if she had follow-up with primary care concerning the FMLA They are aware she is filling out the forms As they are noncardiac issue, they should take over Thx TG       Asked patient to return call if she has any questions.

## 2023-08-11 NOTE — Telephone Encounter (Signed)
Patient is requesting call back from Dr. Windell Hummingbird nurse in regards to anxious feelinsg she is having. Patient gave no further information.

## 2023-09-24 ENCOUNTER — Ambulatory Visit: Payer: BC Managed Care – PPO | Attending: Cardiovascular Disease | Admitting: Cardiovascular Disease

## 2023-09-24 ENCOUNTER — Encounter: Payer: Self-pay | Admitting: Cardiovascular Disease

## 2023-09-30 ENCOUNTER — Other Ambulatory Visit: Payer: Self-pay | Admitting: Cardiovascular Disease

## 2023-12-27 ENCOUNTER — Other Ambulatory Visit: Payer: Self-pay | Admitting: Cardiovascular Disease

## 2023-12-28 NOTE — Telephone Encounter (Signed)
 Please contact pt for future appointment. Pt due for 12 month f/u.

## 2023-12-29 NOTE — Telephone Encounter (Signed)
Pt scheduled on 1/27

## 2024-01-08 NOTE — Progress Notes (Unsigned)
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 who lives with her  On ozempic, tolerating with no significant GI side effects Weight down 10 pounds over the past year  Works 3rd shift, 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  No regular exercise program,   Denies chest pain concerning for angina, no shortness of breath on exertion Tolerating Lipitor, Zetia, Numbers are goal   EKG personally reviewed by myself on todays visit EKG Interpretation Date/Time:  Monday January 11 2024 09:46:45 EST Ventricular Rate:  70 PR Interval:  154 QRS Duration:  80 QT Interval:  398 QTC Calculation: 429 R Axis:   1  Text Interpretation: Normal sinus rhythm Minimal voltage criteria for LVH, may be normal variant ( R in aVL ) When compared with ECG of 26-Jun-2019 23:32, No significant change was found Confirmed by Julien Nordmann [phone]) on 01/11/2024 10:15:19 AM    Other past medical history reviewed  admission to the hospital for STEMI January 2016, subtotally occluded RCA in the mid to distal region also with left circumflex disease. she had DES to her RCA.  DES to her left circumflex. She has residual 30 and 40% proximal and mid LAD disease. Peak troponin of 6.  LV gram showing no wall motion abnormality.    PMH:   has a past medical history of CAD, multiple vessel, STEMI-DES-RCA 01/07/15, pLCX DES-01/08/15  (01/09/2015), Hyperlipidemia LDL goal <70 (01/09/2015), Hypertension, and Myocardial infarction (HCC).  PSH:    Past Surgical History:  Procedure Laterality Date   CARDIAC CATHETERIZATION  01/08/2015   stent to LCX, synergy DES   CORONARY ANGIOPLASTY     stent to LCX, synergy DES   CORONARY STENT PLACEMENT  01/07/2015   rca  DES, STEMI   HYSTEROSCOPY WITH D & C N/A 07/01/2016   Procedure: DILATATION AND CURETTAGE /HYSTEROSCOPY;  Surgeon: Nadara Mustard, MD;  Location: [facility] ORS;  Service: Gynecology;  Laterality: N/A;   LEFT HEART CATHETERIZATION WITH CORONARY ANGIOGRAM N/A 01/07/2015   Procedure: LEFT HEART CATHETERIZATION WITH CORONARY ANGIOGRAM;  Surgeon: Lesleigh Noe, MD;  Location: [facility] CATH LAB;  Service: Cardiovascular;  Laterality: N/A;   PERCUTANEOUS CORONARY STENT INTERVENTION (PCI-S) N/A 01/08/2015   Procedure: PERCUTANEOUS CORONARY STENT INTERVENTION (PCI-S);  Surgeon: Peter M Swaziland, MD;  Location: [facility];  Service: Cardiovascular;  Laterality: N/A;   TUBAL LIGATION      Current Outpatient Medications  Medication Sig Dispense Refill   acetaminophen (TYLENOL) 325 MG tablet Take 2 tablets (650 mg total) by mouth every 4 (four) hours as needed for headache or mild pain.     aspirin 81 MG chewable tablet Chew 1 tablet (81 mg total) by mouth daily.     atorvastatin (LIPITOR) 40 MG tablet TAKE 1 TABLET BY MOUTH EVERY DAY 90 tablet 0   Cyanocobalamin (VITAMIN B-12) 2500  MCG SUBL Place 2,500 mcg under the tongue daily.      ezetimibe (ZETIA) 10 MG tablet Take 1 tablet (10 mg total) by mouth daily. 90 tablet 3   fluticasone (FLONASE) 50 MCG/ACT nasal spray Place 1 spray into both nostrils daily.     losartan-hydrochlorothiazide (HYZAAR) 100-25 MG tablet TAKE 1 TABLET BY MOUTH EVERY DAY 90 tablet 0   metoprolol tartrate (LOPRESSOR) 25 MG tablet TAKE 1 TABLET BY MOUTH TWICE A DAY 180 tablet 0   nitroGLYCERIN (NITROSTAT) 0.4 MG SL tablet Place 1 tablet (0.4 mg total) under the  tongue every 5 (five) minutes as needed for chest pain. 25 tablet 1   potassium chloride (KLOR-CON) 10 MEQ tablet TAKE 2 TABLETS BY MOUTH DAILY 180 tablet 0   Semaglutide, 1 MG/DOSE, (OZEMPIC, 1 MG/DOSE,) 4 MG/3ML SOPN Inject into the skin.     EPINEPHrine 0.3 mg/0.3 mL IJ SOAJ injection INJECT 0.3ML INTO THE MUSCLE ONCE FOR 1 DOSE (Patient not taking: Reported on 01/11/2024)     No current facility-administered medications for this visit.    Allergies:   Ace inhibitors, Sulfa antibiotics, Other, and Shellfish allergy   Social History:  The patient  reports that she quit smoking about 9 years ago. Her smoking use included cigarettes. She started smoking about 39 years ago. She has a 15 pack-year smoking history. She has never used smokeless tobacco. She reports that she does not drink alcohol and does not use drugs.   Family History:   family history is not on file.    Review of Systems: Review of Systems  Constitutional: Negative.   HENT: Negative.    Respiratory: Negative.  Negative for shortness of breath.   Cardiovascular: Negative.  Negative for chest pain.  Gastrointestinal: Negative.   Musculoskeletal: Negative.   Neurological: Negative.   Psychiatric/Behavioral: Negative.    All other systems reviewed and are negative.   PHYSICAL EXAM: VS:  BP 120/80 (BP Location: Left Arm, Patient Position: Sitting, Cuff Size: Large)   Pulse 70   Ht 5' 2.5" (1.588 m)   Wt 236 lb (107 kg)   SpO2 99%   BMI 42.48 kg/m  , BMI Body mass index is 42.48 kg/m.  Constitutional:  oriented to person, place, and time. No distress.  HENT:  Head: Grossly normal Eyes:  no discharge. No scleral icterus.  Neck: No JVD, no carotid bruits  Cardiovascular: Regular rate and rhythm, no murmurs appreciated Pulmonary/Chest: Clear to auscultation bilaterally, no wheezes or rails Abdominal: Soft.  no distension.  no tenderness.  Musculoskeletal: Normal range of motion Neurological:  normal muscle tone.  Coordination normal. No atrophy Skin: Skin warm and dry Psychiatric: normal affect, pleasant   Recent Labs: No results found for requested labs within last 365 days.    Lipid Panel Lab Results  Component Value Date   CHOL 130 11/19/2021   HDL 58 11/19/2021   LDLCALC 51 11/19/2021   TRIG 118 11/19/2021  Was not on Lipitor at the time of this lab work    [employer] Readings from Last 3 Encounters:  01/11/24 236 lb (107 kg)  12/23/22 242 lb (109.8 kg)  11/19/21 239 lb (108.4 kg)      ASSESSMENT AND PLAN:  CAD, multiple vessel, STEMI-DES-RCA 01/07/15, pLCX DES-01/08/15  Currently with no symptoms of angina. No further workup at this time. Continue current medication regimen. Lab work ordered  Essential hypertension Weight trending down 10 pounds over the past year Blood pressure is well controlled on today's  visit. No changes made to the medications.  Hyperlipidemia  Typically cholesterol at goal, new lab work has been ordered  Type 2 diabetes mellitus with other circulatory complication, without long-term current use of insulin (HCC) Lab work ordered, weight trending down on Ozempic   Tobacco abuse Stopped smoking 2017 with Chantix   Orders Placed This Encounter  Procedures   EKG 12-Lead     Signed, Dossie Arbour, M.D., Ph.D. 01/11/2024  [facility] [place], [place] [phone]

## 2024-01-11 ENCOUNTER — Encounter: Payer: Self-pay | Admitting: Cardiovascular Disease

## 2024-01-11 ENCOUNTER — Ambulatory Visit: Payer: BC Managed Care – PPO | Attending: Cardiovascular Disease | Admitting: Cardiovascular Disease

## 2024-01-11 VITALS — BP 120/80 | HR 70 | Ht 62.5 in | Wt 236.0 lb

## 2024-01-11 DIAGNOSIS — I25118 Atherosclerotic heart disease of native coronary artery with other forms of angina pectoris: Secondary | ICD-10-CM

## 2024-01-11 DIAGNOSIS — E785 Hyperlipidemia, unspecified: Secondary | ICD-10-CM

## 2024-01-11 DIAGNOSIS — I10 Essential (primary) hypertension: Secondary | ICD-10-CM | POA: Diagnosis not present

## 2024-01-11 DIAGNOSIS — Z720 Tobacco use: Secondary | ICD-10-CM

## 2024-01-11 DIAGNOSIS — Z79899 Other long term (current) drug therapy: Secondary | ICD-10-CM

## 2024-01-11 DIAGNOSIS — E1159 Type 2 diabetes mellitus with other circulatory complications: Secondary | ICD-10-CM

## 2024-01-11 MED ORDER — METOPROLOL TARTRATE 25 MG PO TABS: 25.0000 mg | ORAL_TABLET | Freq: Two times a day (BID) | ORAL | 3 refills | Status: AC

## 2024-01-11 MED ORDER — EZETIMIBE 10 MG PO TABS: 10.0000 mg | ORAL_TABLET | Freq: Every day | ORAL | 3 refills | Status: DC

## 2024-01-11 MED ORDER — LOSARTAN POTASSIUM-HCTZ 100-25 MG PO TABS: 1.0000 | ORAL_TABLET | Freq: Every day | ORAL | 3 refills | Status: AC

## 2024-01-11 MED ORDER — POTASSIUM CHLORIDE ER 10 MEQ PO TBCR: 20.0000 meq | EXTENDED_RELEASE_TABLET | Freq: Every day | ORAL | 3 refills | Status: AC

## 2024-01-11 MED ORDER — ATORVASTATIN CALCIUM 40 MG PO TABS: 40.0000 mg | ORAL_TABLET | Freq: Every day | ORAL | 3 refills | Status: AC

## 2024-01-11 NOTE — Patient Instructions (Signed)
Medication Instructions:  No changes  If you need a refill on your cardiac medications before your next appointment, please call your pharmacy.   Lab work: Labs: A1C: lipids, CMP  Testing/Procedures: No new testing needed  Follow-Up: At [facility], you and your health needs are our priority.  As part of our continuing mission to provide you with exceptional heart care, we have created designated Provider Care Teams.  These Care Teams include your primary Cardiologist (physician) and Advanced Practice Providers (APPs -  Physician Assistants and Nurse Practitioners) who all work together to provide you with the care you need, when you need it.  You will need a follow up appointment in 12 months  Providers on your designated Care Team:   Nicolasa Ducking, NP Eula Listen, PA-C Cadence Fransico Michael, [place]  COVID-19 Vaccine Information can be found at: [email or web address] For questions related to vaccine distribution or appointments, please email [email or web address] or call [phone].

## 2024-01-12 LAB — HEMOGLOBIN A1C
Est. average glucose Bld gHb Est-mCnc: 128 mg/dL
Hgb A1c MFr Bld: 6.1 % — ABNORMAL HIGH (ref 4.8–5.6)

## 2024-01-12 LAB — COMPREHENSIVE METABOLIC PANEL
ALT: 21 [IU]/L (ref 0–32)
AST: 23 [IU]/L (ref 0–40)
Albumin: 4.1 g/dL (ref 3.9–4.9)
Alkaline Phosphatase: 139 [IU]/L — ABNORMAL HIGH (ref 44–121)
BUN/Creatinine Ratio: 17 (ref 12–28)
BUN: 18 mg/dL (ref 8–27)
Bilirubin Total: 1 mg/dL (ref 0.0–1.2)
CO2: 26 mmol/L (ref 20–29)
Calcium: 9.5 mg/dL (ref 8.7–10.3)
Chloride: 101 mmol/L (ref 96–106)
Creatinine, Ser: 1.04 mg/dL — ABNORMAL HIGH (ref 0.57–1.00)
Globulin, Total: 3 g/dL (ref 1.5–4.5)
Glucose: 89 mg/dL (ref 70–99)
Potassium: 4.3 mmol/L (ref 3.5–5.2)
Sodium: 141 mmol/L (ref 134–144)
Total Protein: 7.1 g/dL (ref 6.0–8.5)
eGFR: 61 mL/min/{1.73_m2} (ref >59)

## 2024-01-12 LAB — LIPID PANEL
Chol/HDL Ratio: 2.2 {ratio} (ref 0.0–4.4)
Cholesterol, Total: 136 mg/dL (ref 100–199)
HDL: 62 mg/dL (ref >39)
LDL Chol Calc (NIH): 60 mg/dL (ref 0–99)
Triglycerides: 69 mg/dL (ref 0–149)
VLDL Cholesterol Cal: 14 mg/dL (ref 5–40)

## 2024-01-15 ENCOUNTER — Encounter: Payer: Self-pay | Admitting: Cardiovascular Disease

## 2024-03-07 ENCOUNTER — Other Ambulatory Visit: Payer: Self-pay

## 2024-03-07 ENCOUNTER — Emergency Department
Admission: EM | Admit: 2024-03-07 | Discharge: 2024-03-07 | Disposition: A | Discharge status: Home / Self Care | Attending: Emergency Medicine | Admitting: Emergency Medicine

## 2024-03-07 ENCOUNTER — Emergency Department

## 2024-03-07 ENCOUNTER — Encounter: Payer: Self-pay | Admitting: Emergency Medicine

## 2024-03-07 DIAGNOSIS — S0990XA Unspecified injury of head, initial encounter: Secondary | ICD-10-CM | POA: Diagnosis present

## 2024-03-07 DIAGNOSIS — E119 Type 2 diabetes mellitus without complications: Secondary | ICD-10-CM | POA: Diagnosis not present

## 2024-03-07 DIAGNOSIS — I10 Essential (primary) hypertension: Secondary | ICD-10-CM | POA: Diagnosis not present

## 2024-03-07 DIAGNOSIS — M25511 Pain in right shoulder: Secondary | ICD-10-CM | POA: Diagnosis not present

## 2024-03-07 DIAGNOSIS — S0181XA Laceration without foreign body of other part of head, initial encounter: Secondary | ICD-10-CM | POA: Diagnosis not present

## 2024-03-07 DIAGNOSIS — W01198A Fall on same level from slipping, tripping and stumbling with subsequent striking against other object, initial encounter: Secondary | ICD-10-CM | POA: Insufficient documentation

## 2024-03-07 DIAGNOSIS — I2510 Atherosclerotic heart disease of native coronary artery without angina pectoris: Secondary | ICD-10-CM | POA: Diagnosis not present

## 2024-03-07 MED ORDER — CYCLOBENZAPRINE HCL 10 MG PO TABS: 10.0000 mg | ORAL_TABLET | Freq: Three times a day (TID) | ORAL | 0 refills | Status: AC | PRN

## 2024-03-07 MED ORDER — OXYCODONE-ACETAMINOPHEN 5-325 MG PO TABS
1.0000 | ORAL_TABLET | Freq: Once | ORAL | Status: AC
  Administered 2024-03-07: 1 via ORAL
  Filled 2024-03-07: qty 1

## 2024-03-07 MED ORDER — LIDOCAINE HCL (PF) 1 % IJ SOLN
5.0000 mL | Freq: Once | INTRAMUSCULAR | Status: AC
  Administered 2024-03-07: 5 mL via INTRADERMAL
  Filled 2024-03-07: qty 5

## 2024-03-07 NOTE — ED Triage Notes (Addendum)
 First nurse note: PT here via [facility] from home. Pt had witnessed fall. Pt tripped on flip flops and hit corner of desk, lac to top of head, pt denies blood thinner use.   137/80 HR: 72 98% RA  - stroke screen negative per EMS

## 2024-03-07 NOTE — ED Provider Notes (Signed)
 [facility] Provider Note    Event Date/Time   First MD Initiated Contact with Patient 03/07/24 2146     (approximate)   History   Fall (/)   HPI  Vanessa Snow is a 63 y.o. female with PMH of hypertension, CAD and diabetes type 2 presents for evaluation after a fall.  Patient states that she tripped on her shoes causing her to fall forward and hit her head on the corner of a desk.  No LOC, no vomiting.  Patient does take aspirin but no other blood thinners.  She also thinks she fell on her right shoulder and has is having pain there at this time.      Physical Exam   Triage Vital Signs: ED Triage Vitals  Encounter Vitals Group     BP 03/07/24 1713 138/64     Systolic BP Percentile --      Diastolic BP Percentile --      Pulse Rate 03/07/24 1713 67     Resp 03/07/24 1713 18     Temp 03/07/24 1713 97.8 F (36.6 C)     Temp Source 03/07/24 1713 Oral     SpO2 03/07/24 1713 98 %     Weight 03/07/24 1712 225 lb (102.1 kg)     Height 03/07/24 1712 5' 2.5" (1.588 m)     Head Circumference --      Peak Flow --      Pain Score 03/07/24 1712 8     Pain Loc --      Pain Education --      Exclude from Growth Chart --     Most recent vital signs: Vitals:   03/07/24 1713 03/07/24 2207  BP: 138/64 (!) 159/84  Pulse: 67 76  Resp: 18 20  Temp: 97.8 F (36.6 C) 97.6 F (36.4 C)  SpO2: 98% 100%   General: Awake, no distress.  CV:  Good peripheral perfusion.  RRR. Resp:  Normal effort.  CTAB. Abd:  No distention.  R arm:  Right shoulder is tender to palpation posteriorly, unable to perform range of motion due to pain, grip strength is equal bilaterally.  Radial pulses 2+ and regular.  Sensation maintained in dermatomes of the right arm. Neuro:  No focal neurodeficits, no ataxia, PERRL. Other:  Approximately 3 cm long laceration on the right forehead, bleeding is controlled.   ED Results / Procedures / Treatments   Labs (all labs ordered  are listed, but only abnormal results are displayed) Labs Reviewed - No data to display   RADIOLOGY  CT head and right shoulder x-ray obtained, interpreted the images as well as reviewed the radiologist report which was negative for any acute abnormalities.  PROCEDURES:  Critical Care performed: No  .Laceration Repair  Date/Time: 03/07/2024 11:00 PM  Performed by: Cameron Ali, PA-C Authorized by: Cameron Ali, PA-C   Consent:    Consent obtained:  Verbal   Consent given by:  Patient   Risks, benefits, and alternatives were discussed: yes     Risks discussed:  Pain, poor cosmetic result, need for additional repair and infection   Alternatives discussed:  No treatment Universal protocol:    Patient identity confirmed:  Verbally with patient Anesthesia:    Anesthesia method:  Local infiltration   Local anesthetic:  Lidocaine 1% w/o epi Laceration details:    Location:  Face   Face location:  Forehead   Length (cm):  3   Depth (mm):  5 Pre-procedure details:    Preparation:  Patient was prepped and draped in usual sterile fashion and imaging obtained to evaluate for foreign bodies Exploration:    Hemostasis achieved with:  Direct pressure   Imaging obtained comment:  Head ct   Wound exploration: entire depth of wound visualized     Contaminated: no   Treatment:    Area cleansed with:  Povidone-iodine   Amount of cleaning:  Standard   Irrigation solution:  Sterile saline   Irrigation volume:  30 mL   Irrigation method:  Syringe Skin repair:    Repair method:  Sutures   Suture size:  6-0   Suture material:  Prolene   Suture technique:  Simple interrupted   Number of sutures:  5 Approximation:    Approximation:  Close Repair type:    Repair type:  Simple Post-procedure details:    Dressing:  Adhesive bandage   Procedure completion:  Tolerated well, no immediate complications    MEDICATIONS ORDERED IN ED: Medications  oxyCODONE-acetaminophen  (PERCOCET/ROXICET) 5-325 MG per tablet 1 tablet (1 tablet Oral Given 03/07/24 2206)  lidocaine (PF) (XYLOCAINE) 1 % injection 5 mL (5 mLs Intradermal Given 03/07/24 2205)     IMPRESSION / MDM / ASSESSMENT AND PLAN / ED COURSE  I reviewed the triage vital signs and the nursing notes.                             63 year old female presents for evaluation after a fall.  Blood pressure is elevated otherwise vital signs are stable.  Patient no acute distress on exam.  Differential diagnosis includes, but is not limited to, laceration, concussion, contusion, shoulder dislocation, fracture, muscle strain.  Patient's presentation is most consistent with acute complicated illness / injury requiring diagnostic workup.  CT scan of the head and right shoulder x-ray were both negative.  There is no focal neurodeficits on exam.  Laceration repaired as described in the procedure note above.  Patient was educated on wound care.  We discussed following up with orthopedics for ongoing pain in the right shoulder.  She was given pain medication while in the emergency department.  We reviewed over-the-counter pain medications to take at home.  Patient voiced understanding, all questions were answered and she was stable at discharge.     FINAL CLINICAL IMPRESSION(S) / ED DIAGNOSES   Final diagnoses:  Laceration of forehead, initial encounter  Acute pain of right shoulder     Rx / DC Orders   ED Discharge Orders     None        Note:  This document was prepared using Dragon voice recognition software and may include unintentional dictation errors.   Cameron Ali, PA-C 03/07/24 2302    Jene Every, MD 03/09/24 [phone]

## 2024-03-07 NOTE — ED Provider Triage Note (Signed)
 Emergency Medicine Provider Triage Evaluation Note  Andreana Klingerman , a 63 y.o. female  was evaluated in triage.  Pt complains of fall today after tripping on her shoes.  Patient hit her head with a desk, denies loss of consciousness.  Patient is taking aspirin  Review of Systems  Positive:  Negative: Right shoulder pain  Physical Exam  Ht 5' 2.5" (1.588 m)   Wt 102.1 kg   BMI 40.50 kg/m  Gen:   Awake, no distress  in pain Resp:  Normal effort  MSK:   Moves extremities without difficulty right shoulder tender to palpation Other:    Medical Decision Making  Medically screening exam initiated at 5:12 PM.  Appropriate orders placed.  Brandi Poteat-Faucette was informed that the remainder of the evaluation will be completed by another provider, this initial triage assessment does not replace that evaluation, and the importance of remaining in the ED until their evaluation is complete.  Patient with fall, possible right shoulder fracture, head trauma.  Ordering right shoulder x-ray   Gladys Damme, PA-C 03/07/24 1714

## 2024-03-07 NOTE — Discharge Instructions (Addendum)
 Stitches will need to be removed in about 5 days.  This can be done by your primary care, urgent care or the emergency department.  Wash the wound daily with soap and water then pat dry and you can cover with a bandage if you would like. Watch for signs of infection including redness, warmth, swelling, pain and pus drainage.  If you develop any of these please return to the ED, urgent care or your primary care provider.  You can take 650 mg of Tylenol and 600 mg of ibuprofen every 6 hours as needed for pain.  You can use ice, heat and muscle creams on your shoulder.  I have attached information for orthopedics, you can schedule an appointment with them for follow-up as needed.

## 2024-03-07 NOTE — ED Triage Notes (Signed)
 Patient to ED via [facility] from home after a fall. Pt reports tripping over her shoes and hitting her head on a desk. Head wrapped by EMS- bleeding controlled. Denies LOC, takes ASA. Also c/o right shoulder pain

## 2024-12-25 ENCOUNTER — Other Ambulatory Visit: Payer: Self-pay | Admitting: Cardiovascular Disease

## 2025-01-15 NOTE — Progress Notes (Unsigned)
 Cardiology Office Note  Date:  01/15/2025   ID:  Vanessa Snow, DOB 01-17-1961, MRN [id number]  PCP:  Gretta Ozell CROME, PA-C   No chief complaint on file.   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 she had DES to her RCA.  DES to her left circumflex. She has residual 30 and 40% proximal and mid LAD disease. Peak troponin of 6.  LV gram showing no wall motion abnormality.    PMH:   has a past medical history of CAD, multiple vessel, STEMI-DES-RCA 01/07/15, pLCX DES-01/08/15 (01/09/2015), Hyperlipidemia LDL goal <70 (01/09/2015), Hypertension, and Myocardial infarction (HCC).  PSH:    Past Surgical History:  Procedure Laterality Date   CARDIAC CATHETERIZATION  01/08/2015   stent to LCX, synergy DES   CORONARY ANGIOPLASTY     stent to LCX, synergy DES   CORONARY STENT PLACEMENT  01/07/2015   rca  DES, STEMI   HYSTEROSCOPY WITH D & C N/A 07/01/2016   Procedure: DILATATION AND CURETTAGE /HYSTEROSCOPY;  Surgeon: Lamar SHAUNNA Lesches, MD;  Location: [facility]  ORS;  Service: Gynecology;  Laterality: N/A;   LEFT HEART CATHETERIZATION WITH CORONARY ANGIOGRAM N/A 01/07/2015   Procedure: LEFT HEART CATHETERIZATION WITH CORONARY ANGIOGRAM;  Surgeon: Victory LELON Claudene DOUGLAS, MD;  Location: [facility] CATH LAB;  Service: Cardiovascular;  Laterality: N/A;   PERCUTANEOUS CORONARY STENT INTERVENTION (PCI-S) N/A 01/08/2015   Procedure: PERCUTANEOUS CORONARY STENT INTERVENTION (PCI-S);  Surgeon: Peter M Jordan, MD;  Location: [facility] CATH LAB;  Service: Cardiovascular;  Laterality: N/A;   TUBAL LIGATION      Current Outpatient Medications  Medication Sig Dispense Refill   acetaminophen  (TYLENOL ) 325 MG tablet Take 2 tablets (650 mg total) by mouth every 4 (four) hours as needed for headache or mild pain.     aspirin  81 MG chewable tablet Chew 1 tablet (81 mg total) by mouth daily.     atorvastatin  (LIPITOR ) 40 MG tablet Take 1 tablet (40 mg total) by mouth daily. 90 tablet 3   Cyanocobalamin (VITAMIN B-12) 2500 MCG SUBL Place 2,500 mcg under the tongue daily.      cyclobenzaprine  (FLEXERIL ) 10 MG tablet Take 1 tablet (10 mg total) by mouth 3 (three) times daily as needed. 30 tablet 0   EPINEPHrine  0.3 mg/0.3 mL IJ SOAJ injection INJECT 0.3ML INTO THE MUSCLE ONCE FOR 1 DOSE (Patient not taking: Reported on 01/11/2024)     ezetimibe  (ZETIA ) 10 MG tablet TAKE 1 TABLET BY MOUTH EVERY DAY 90 tablet 0   fluticasone (FLONASE) 50 MCG/ACT nasal  spray Place 1 spray into both nostrils daily.     losartan -hydrochlorothiazide  (HYZAAR) 100-25 MG tablet Take 1 tablet by mouth daily. 90 tablet 3   metoprolol  tartrate (LOPRESSOR ) 25 MG tablet Take 1 tablet (25 mg total) by mouth 2 (two) times daily. 180 tablet 3   nitroGLYCERIN  (NITROSTAT ) 0.4 MG SL tablet Place 1 tablet (0.4 mg total) under the tongue every 5 (five) minutes as needed for chest pain. 25 tablet 1   potassium chloride  (KLOR-CON ) 10 MEQ tablet Take 2 tablets (20 mEq total) by mouth daily. 180 tablet 3   Semaglutide, 1 MG/DOSE, (OZEMPIC,  1 MG/DOSE,) 4 MG/3ML SOPN Inject into the skin.     No current facility-administered medications for this visit.    Allergies:   Ace inhibitors, Sulfa antibiotics, Other, and Shellfish allergy   Social History:  The patient  reports that she quit smoking about 10 years ago. Her smoking use included cigarettes. She started smoking about 40 years ago. She has a 15 pack-year smoking history. She has never used smokeless tobacco. She reports that she does not drink alcohol and does not use drugs.   Family History:   family history is not on file.    Review of Systems: Review of Systems  Constitutional: Negative.   HENT: Negative.    Respiratory: Negative.  Negative for shortness of breath.   Cardiovascular: Negative.  Negative for chest pain.  Gastrointestinal: Negative.   Musculoskeletal: Negative.   Neurological: Negative.   Psychiatric/Behavioral: Negative.    All other systems reviewed and are negative.   PHYSICAL EXAM: VS:  There were no vitals taken for this visit. , BMI There is no height or weight on file to calculate BMI.  Constitutional:  oriented to person, place, and time. No distress.  HENT:  Head: Grossly normal Eyes:  no discharge. No scleral icterus.  Neck: No JVD, no carotid bruits  Cardiovascular: Regular rate and rhythm, no murmurs appreciated Pulmonary/Chest: Clear to auscultation bilaterally, no wheezes or rails Abdominal: Soft.  no distension.  no tenderness.  Musculoskeletal: Normal range of motion Neurological:  normal muscle tone. Coordination normal. No atrophy Skin: Skin warm and dry Psychiatric: normal affect, pleasant   Recent Labs: No results found for requested labs within last 365 days.    Lipid Panel Lab Results  Component Value Date   CHOL 136 01/11/2024   HDL 62 01/11/2024   LDLCALC 60 01/11/2024   TRIG 69 01/11/2024  Was not on Lipitor  at the time of this lab work    [employer] Readings from Last 3 Encounters:  03/07/24 225 lb (102.1 kg)   01/11/24 236 lb (107 kg)  12/23/22 242 lb (109.8 kg)      ASSESSMENT AND PLAN:  CAD, multiple vessel, STEMI-DES-RCA 01/07/15, pLCX DES-01/08/15  Currently with no symptoms of angina. No further workup at this time. Continue current medication regimen. Lab work ordered  Essential hypertension Weight trending down 10 pounds over the past year Blood pressure is well controlled on today's visit. No changes made to the medications.  Hyperlipidemia  Typically cholesterol at goal, new lab work has been ordered  Type 2 diabetes mellitus with other circulatory complication, without long-term current use of insulin (HCC) Lab work ordered, weight trending down on Ozempic   Tobacco abuse Stopped smoking 2017 with Chantix    No orders of the defined types were placed in this encounter.    Signed, Velinda Lunger, M.D., Ph.D. 01/15/2025  [facility] [place], [place] [phone]

## 2025-01-16 ENCOUNTER — Ambulatory Visit: Admitting: Cardiovascular Disease

## 2025-01-16 DIAGNOSIS — I2510 Atherosclerotic heart disease of native coronary artery without angina pectoris: Secondary | ICD-10-CM

## 2025-01-16 DIAGNOSIS — E785 Hyperlipidemia, unspecified: Secondary | ICD-10-CM

## 2025-01-16 DIAGNOSIS — Z720 Tobacco use: Secondary | ICD-10-CM

## 2025-01-16 DIAGNOSIS — E1159 Type 2 diabetes mellitus with other circulatory complications: Secondary | ICD-10-CM

## 2025-01-16 DIAGNOSIS — I10 Essential (primary) hypertension: Secondary | ICD-10-CM

## 2025-01-24 ENCOUNTER — Ambulatory Visit: Admitting: Cardiovascular Disease

## 2025-03-24 ENCOUNTER — Ambulatory Visit: Admitting: Cardiovascular Disease
